# Patient Record
Sex: Female | Born: 1940
Health system: Southern US, Community
[De-identification: ages and names within clinical notes are randomized; demographics above are authoritative.]

## PROBLEM LIST (undated history)

## (undated) DIAGNOSIS — F329 Major depressive disorder, single episode, unspecified: Secondary | ICD-10-CM

## (undated) DIAGNOSIS — F32A Depression, unspecified: Secondary | ICD-10-CM

## (undated) DIAGNOSIS — C801 Malignant (primary) neoplasm, unspecified: Secondary | ICD-10-CM

## (undated) DIAGNOSIS — Z8619 Personal history of other infectious and parasitic diseases: Secondary | ICD-10-CM

## (undated) DIAGNOSIS — E039 Hypothyroidism, unspecified: Secondary | ICD-10-CM

## (undated) DIAGNOSIS — Z87440 Personal history of urinary (tract) infections: Secondary | ICD-10-CM

## (undated) DIAGNOSIS — Z789 Other specified health status: Secondary | ICD-10-CM

## (undated) DIAGNOSIS — K219 Gastro-esophageal reflux disease without esophagitis: Secondary | ICD-10-CM

## (undated) HISTORY — PX: CHOLECYSTECTOMY: SHX55

## (undated) HISTORY — PX: OTHER SURGICAL HISTORY: SHX169

## (undated) HISTORY — PX: BREAST LUMPECTOMY: SHX2

## (undated) HISTORY — DX: Personal history of other infectious and parasitic diseases: Z86.19

## (undated) HISTORY — DX: Malignant (primary) neoplasm, unspecified: C80.1

## (undated) HISTORY — DX: Personal history of urinary (tract) infections: Z87.440

---
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
## 2010-07-16 ENCOUNTER — Ambulatory Visit (HOSPITAL_COMMUNITY)
Admission: RE | Admit: 2010-07-16 | Discharge: 2010-07-16 | Payer: Self-pay | Source: Home / Self Care | Attending: Obstetrics & Gynecology | Admitting: Obstetrics & Gynecology

## 2011-05-26 ENCOUNTER — Emergency Department (HOSPITAL_BASED_OUTPATIENT_CLINIC_OR_DEPARTMENT_OTHER)
Admission: EM | Admit: 2011-05-26 | Discharge: 2011-05-26 | Disposition: A | Discharge status: Home / Self Care | Payer: Medicare Other | Attending: Emergency Medicine | Admitting: Emergency Medicine

## 2011-05-26 ENCOUNTER — Emergency Department (INDEPENDENT_AMBULATORY_CARE_PROVIDER_SITE_OTHER): Payer: Medicare Other

## 2011-05-26 ENCOUNTER — Encounter: Payer: Self-pay | Admitting: *Deleted

## 2011-05-26 DIAGNOSIS — S91009A Unspecified open wound, unspecified ankle, initial encounter: Secondary | ICD-10-CM

## 2011-05-26 DIAGNOSIS — Y9229 Other specified public building as the place of occurrence of the external cause: Secondary | ICD-10-CM | POA: Insufficient documentation

## 2011-05-26 DIAGNOSIS — S81012A Laceration without foreign body, left knee, initial encounter: Secondary | ICD-10-CM

## 2011-05-26 DIAGNOSIS — S81009A Unspecified open wound, unspecified knee, initial encounter: Secondary | ICD-10-CM | POA: Insufficient documentation

## 2011-05-26 DIAGNOSIS — W19XXXA Unspecified fall, initial encounter: Secondary | ICD-10-CM | POA: Insufficient documentation

## 2011-05-26 MED ORDER — TETANUS-DIPHTH-ACELL PERTUSSIS 5-2.5-18.5 LF-MCG/0.5 IM SUSP
0.5000 mL | Freq: Once | INTRAMUSCULAR | Status: AC
  Administered 2011-05-26: 0.5 mL via INTRAMUSCULAR
  Filled 2011-05-26: qty 0.5

## 2011-05-26 MED ORDER — LIDOCAINE HCL 2 % IJ SOLN
5.0000 mL | Freq: Once | INTRAMUSCULAR | Status: AC
  Administered 2011-05-26: 20 mg
  Filled 2011-05-26: qty 1

## 2011-05-26 NOTE — ED Notes (Signed)
EDP [facility] at bedside to suture left knee lac

## 2011-05-26 NOTE — ED Notes (Addendum)
Pt states she fell approx 1 hr ago and injured her left knee. Approx 3 cm lac to same. Bleeding controlled.

## 2011-05-26 NOTE — ED Provider Notes (Signed)
History  Scribed for Carleene Cooper III, MD, the patient was seen in [facility]. The chart was scribed by Gilman Schmidt. The patients care was started at 8:07 PM.   CSN: [id number] Arrival date & time: 05/26/2011  7:28 PM   First MD Initiated Contact with Patient 05/26/11 1944      Chief Complaint  Patient presents with  . Laceration    HPI Jasmine Williams is a 70 y.o. female who presents to the Emergency Department complaining of laceration to left knee. Pt reports falling ~1 hour ago on tile floor at [employer]. Pt was ambulatory after fall. Denies any LOC or any other injury. States that last Tetanus shot may have been more than 10 years ago.There are no other associated symptoms and no other alleviating or aggravating factors.    History reviewed. No pertinent past medical history.  Past Surgical History  Procedure Date  . Cholecystectomy     History reviewed. No pertinent family history.  History  Substance Use Topics  . Smoking status: Never Smoker   . Smokeless tobacco: Not on file  . Alcohol Use: No    OB History    Grav Para Term Preterm Abortions TAB SAB Ect Mult Living                  Review of Systems  HENT: Negative for sore throat and neck pain.   Cardiovascular: Negative for chest pain.  Genitourinary: Negative for dysuria and urgency.  Musculoskeletal: Negative for back pain.  Skin: Positive for wound.  Neurological: Negative for seizures, weakness, numbness and headaches.    Allergies  Review of patient's allergies indicates no known allergies.  Home Medications  No current outpatient prescriptions on file.  BP 166/64  Pulse 86  Temp(Src) 98.4 F (36.9 C) (Oral)  Resp 20  Ht 5' (1.524 m)  Wt 120 lb (54.432 kg)  BMI 23.44 kg/m2  SpO2 98%  Physical Exam  Constitutional: She is oriented to person, place, and time. She appears well-developed and well-nourished.  Non-toxic appearance. She does not have a sickly appearance.  HENT:  Head:  Normocephalic and atraumatic.  Eyes: Conjunctivae, EOM and lids are normal. Pupils are equal, round, and reactive to light. No scleral icterus.  Neck: Trachea normal and normal range of motion. Neck supple.  Cardiovascular: Normal rate, regular rhythm and normal heart sounds.   Pulmonary/Chest: Effort normal and breath sounds normal.  Abdominal: Soft. Normal appearance. There is no tenderness. There is no rebound, no guarding and no CVA tenderness.  Musculoskeletal: Normal range of motion.  Neurological: She is alert and oriented to person, place, and time. She has normal strength.  Skin: Skin is warm, dry and intact. No rash noted.       5cm laceration to right knee    ED Course  LACERATION REPAIR Date/Time: 05/26/2011 9:03 PM Performed by: Osvaldo Human Authorized by: Osvaldo Human Consent: Verbal consent obtained. Risks and benefits: risks, benefits and alternatives were discussed Consent given by: patient Site marked: the operative site was not marked Imaging studies: imaging studies available Required items: required blood products, implants, devices, and special equipment available Patient identity confirmed: verbally with patient Time out: Immediately prior to procedure a "time out" was called to verify the correct patient, procedure, equipment, support staff and site/side marked as required. Body area: lower extremity Location details: right knee Laceration length: 5 cm Foreign bodies: no foreign bodies Tendon involvement: none Nerve involvement: none Vascular damage: no Anesthesia: local infiltration  Local anesthetic: lidocaine 2% without epinephrine Patient sedated: no Preparation: Patient was prepped and draped in the usual sterile fashion. Irrigation solution: saline Irrigation method: tap Amount of cleaning: standard Debridement: none Skin closure: 3-0 nylon and 4-0 nylon Subcutaneous closure: 4-0 Vicryl Number of sutures: 11 Technique:  simple Approximation: close Approximation difficulty: simple Dressing: 4x4 sterile gauze Patient tolerance: Patient tolerated the procedure well with no immediate complications.   DIAGNOSTIC STUDIES: Oxygen Saturation is 98% on room air, normal by my interpretation.    COORDINATION OF CARE: 8:07:  - Patient evaluated by ED physician, DG Knee ordered  Radiology:  DG Knee Complete 4 View. Reviewed by me. IMPRESSION: No osseous abnormality. Original Report Authenticated By: Arnell Sieving, M.D.  Imp:  Laceration of left knee.   I personally performed the services described in this documentation, which was scribed in my presence. The recorded information has been reviewed and considered.  Osvaldo Human, M.D.    Carleene Cooper III, MD 05/26/11 2109

## 2011-05-26 NOTE — ED Notes (Signed)
No rx given- verbalizes understanding to have sutures out in 10 days

## 2011-06-27 ENCOUNTER — Other Ambulatory Visit: Payer: Self-pay | Admitting: Obstetrics & Gynecology

## 2011-06-27 DIAGNOSIS — R928 Other abnormal and inconclusive findings on diagnostic imaging of breast: Secondary | ICD-10-CM

## 2011-07-10 ENCOUNTER — Other Ambulatory Visit: Payer: Self-pay | Admitting: Obstetrics & Gynecology

## 2011-07-10 ENCOUNTER — Ambulatory Visit
Admission: RE | Admit: 2011-07-10 | Discharge: 2011-07-10 | Disposition: A | Discharge status: Home / Self Care | Payer: Medicare Other | Source: Ambulatory Visit | Attending: Obstetrics & Gynecology | Admitting: Obstetrics & Gynecology

## 2011-07-10 DIAGNOSIS — R928 Other abnormal and inconclusive findings on diagnostic imaging of breast: Secondary | ICD-10-CM

## 2011-07-10 DIAGNOSIS — R921 Mammographic calcification found on diagnostic imaging of breast: Secondary | ICD-10-CM

## 2011-07-17 ENCOUNTER — Ambulatory Visit
Admission: RE | Admit: 2011-07-17 | Discharge: 2011-07-17 | Disposition: A | Discharge status: Home / Self Care | Payer: Medicare Other | Source: Ambulatory Visit | Attending: Obstetrics & Gynecology | Admitting: Obstetrics & Gynecology

## 2011-07-17 ENCOUNTER — Other Ambulatory Visit: Payer: Self-pay | Admitting: Obstetrics & Gynecology

## 2011-07-17 DIAGNOSIS — R921 Mammographic calcification found on diagnostic imaging of breast: Secondary | ICD-10-CM

## 2011-07-17 DIAGNOSIS — C50319 Malignant neoplasm of lower-inner quadrant of unspecified female breast: Secondary | ICD-10-CM | POA: Diagnosis not present

## 2011-07-17 DIAGNOSIS — N6489 Other specified disorders of breast: Secondary | ICD-10-CM | POA: Diagnosis not present

## 2011-07-17 DIAGNOSIS — D0590 Unspecified type of carcinoma in situ of unspecified breast: Secondary | ICD-10-CM | POA: Diagnosis not present

## 2011-07-18 ENCOUNTER — Other Ambulatory Visit: Payer: Self-pay | Admitting: Obstetrics & Gynecology

## 2011-07-18 DIAGNOSIS — C50911 Malignant neoplasm of unspecified site of right female breast: Secondary | ICD-10-CM

## 2011-07-22 ENCOUNTER — Ambulatory Visit
Admission: RE | Admit: 2011-07-22 | Discharge: 2011-07-22 | Disposition: A | Discharge status: Home / Self Care | Payer: Medicare Other | Source: Ambulatory Visit | Attending: Obstetrics & Gynecology | Admitting: Obstetrics & Gynecology

## 2011-07-22 DIAGNOSIS — C50911 Malignant neoplasm of unspecified site of right female breast: Secondary | ICD-10-CM

## 2011-07-22 DIAGNOSIS — C50919 Malignant neoplasm of unspecified site of unspecified female breast: Secondary | ICD-10-CM | POA: Diagnosis not present

## 2011-07-22 MED ORDER — GADOBENATE DIMEGLUMINE 529 MG/ML IV SOLN
11.0000 mL | Freq: Once | INTRAVENOUS | Status: AC | PRN
  Administered 2011-07-22: 11 mL via INTRAVENOUS

## 2011-07-24 DIAGNOSIS — N83209 Unspecified ovarian cyst, unspecified side: Secondary | ICD-10-CM | POA: Diagnosis not present

## 2011-07-25 ENCOUNTER — Encounter (INDEPENDENT_AMBULATORY_CARE_PROVIDER_SITE_OTHER): Payer: Self-pay | Admitting: General Surgery

## 2011-07-25 ENCOUNTER — Other Ambulatory Visit (INDEPENDENT_AMBULATORY_CARE_PROVIDER_SITE_OTHER): Payer: Self-pay | Admitting: General Surgery

## 2011-07-25 ENCOUNTER — Ambulatory Visit (INDEPENDENT_AMBULATORY_CARE_PROVIDER_SITE_OTHER): Payer: Medicare Other | Admitting: General Surgery

## 2011-07-25 VITALS — BP 148/78 | HR 89 | Temp 97.7°F | Ht 60.0 in | Wt 125.0 lb

## 2011-07-25 DIAGNOSIS — C50319 Malignant neoplasm of lower-inner quadrant of unspecified female breast: Secondary | ICD-10-CM

## 2011-07-25 NOTE — Progress Notes (Signed)
Patient ID: Jasmine Williams, female   DOB: Sep 20, 1940, 71 y.o.   MRN: [id number]  Chief Complaint  Patient presents with  . Pre-op Exam    eval Rt br cancer    HPI Jasmine Williams is a 71 y.o. female.  Referred by Dr. Laveda Abbe HPI This is a 71 year old female with a history of fibrocystic breast changes who presents after going a regular routine screening mammogram with 2 separate areas of abnormality found in the lower right breast.  These were both biopsied. The first biopsy which is anterior showed DCIS.  The second biopsy shows invasive ductal carcinoma.  ER is positive at 100%, PR is positive at 11%, Ki67 is 75%, and Her2Neu is not amplified.  MRI has been performed that shows two separate areas of biopsy proven cancer within the lower right breast.  There is 5.6 cm between two cavities but they appear to be in the same ray of breast tissue after reviewing films with radiology.  There is no evidence of contralateral neoplasm or any abnormal nodes.  She comes in today to discuss treatment.  .History reviewed. No pertinent past medical history.  Past Surgical History  Procedure Date  . Cholecystectomy   . Broken leg     right as a child    Family History  Problem Relation Age of Onset  . Cancer Sister     breast    Social History History  Substance Use Topics  . Smoking status: Former Smoker    Quit date: 07/25/1971  . Smokeless tobacco: Not on file  . Alcohol Use: No    No Known Allergies  No current outpatient prescriptions on file.    Review of Systems Review of Systems  Constitutional: Negative for fever, chills and unexpected weight change.  HENT: Negative for hearing loss, congestion, sore throat, trouble swallowing and voice change.   Eyes: Negative for visual disturbance.  Respiratory: Negative for cough and wheezing.   Cardiovascular: Negative for chest pain, palpitations and leg swelling.  Gastrointestinal: Negative for nausea, vomiting, abdominal pain,  diarrhea, constipation, blood in stool, abdominal distention and anal bleeding.  Genitourinary: Negative for hematuria, vaginal bleeding and difficulty urinating.  Musculoskeletal: Negative for arthralgias.  Skin: Negative for rash and wound.  Neurological: Negative for seizures, syncope and headaches.  Hematological: Negative for adenopathy. Does not bruise/bleed easily.  Psychiatric/Behavioral: Negative for confusion.    Blood pressure 148/78, pulse 89, temperature 97.7 F (36.5 C), temperature source Temporal, height 5' (1.524 m), weight 125 lb (56.7 kg), SpO2 98.00%.  Physical Exam Physical Exam  Vitals reviewed. Constitutional: She appears well-developed and well-nourished.  Neck: Neck supple.  Cardiovascular: Normal rate, regular rhythm and normal heart sounds.   Pulmonary/Chest: Effort normal and breath sounds normal. She has no wheezes. She has no rales. Right breast exhibits no inverted nipple, no mass, no nipple discharge, no skin change and no tenderness. Left breast exhibits no inverted nipple, no mass, no nipple discharge, no skin change and no tenderness. Breasts are symmetrical.    Abdominal: Soft. There is no hepatomegaly.  Lymphadenopathy:    She has no cervical adenopathy.    Data Reviewed  BILATERAL BREAST MRI WITH AND WITHOUT CONTRAST  Technique: Multiplanar, multisequence MR images of both breasts  were obtained prior to and following the intravenous administration  of 11ml of Multihance. Three dimensional images were evaluated at  the independent DynaCad workstation.  Comparison: Recent mammograms from the [facility].  Findings: Mild normal background parenchymal enhancement  is  identified.  Two separate areas of enhancement within the lower right breast are  identified with biopsy clip artifact, compatible with biopsy-proven  neoplasm. This area measures 5.6 cm (AP) from the anterior aspect  of the anterior biopsy cavity to the posterior aspect of the    posterior biopsy cavity.  No other suspicious areas of enhancement within either breast  noted.  No enlarged or suspicious lymph nodes are identified.  No suspicious bony or upper hepatic abnormalities identified.  IMPRESSION:  Two separate areas of biopsy-proven neoplasm/postbiopsy changes  within the lower right breast (mid and posterior), with distance of  5.6 cm from the front of the anterior biopsy cavity to the back of  the posterior biopsy cavity.  No evidence of contralateral neoplasm or suspicious lymph nodes.   Assessment    Clinical stage I right breast cancer    Plan    Left breast bracketed wire lumpectomy, left axillary sentinel node biopsy  I think after reviewing films that this is amenable to lumpectomy and I think I can give her a good cosmetic result afterwards.  We discussed the staging and pathophysiology of breast cancer. We discussed all of the different options for treatment for breast cancer including surgery, chemotherapy, radiation therapy, Herceptin, and antiestrogen therapy.   We discussed a sentinel lymph node biopsy as she does not appear to having lymph node involvement right now. We discussed the performance of that with injection of radioactive tracer and blue dye. We discussed that she would have an incision underneath her axillary hairline. We discussed that there is a bout a 10-20% chance of having a positive node with a sentinel lymph node biopsy and we will await the permanent pathology to make any other first further decisions in terms of her treatment. One of these options might be to return to the operating room to perform an axillary lymph node dissection. We discussed about a 1-2% risk lifetime of chronic shoulder pain as well as lymphedema associated with a sentinel lymph node biopsy.  We discussed the options for treatment of the breast cancer which included lumpectomy versus a mastectomy. We discussed the performance of the lumpectomy with a  wire placement. We discussed a 10-20% chance of a positive margin requiring reexcision in the operating room. We also discussed that she may need radiation therapy or antiestrogen therapy or both if she undergoes lumpectomy. We discussed the mastectomy and the postoperative care for that as well. We discussed that there is no difference in her survival whether she undergoes lumpectomy with radiation therapy or antiestrogen therapy versus a mastectomy. There is a slight difference in the local recurrence rate being 3-5% with lumpectomy and about 1% with a mastectomy. We discussed the risks of operation including bleeding, infection, possible reoperation. She understands her further therapy will be based on what her stages at the time of her operation.         Anterio Scheel 07/25/2011, 4:02 PM

## 2011-07-30 ENCOUNTER — Encounter (HOSPITAL_BASED_OUTPATIENT_CLINIC_OR_DEPARTMENT_OTHER): Payer: Self-pay | Admitting: *Deleted

## 2011-07-30 NOTE — Progress Notes (Signed)
No meds-no heart or resp problems-to come in for labs,cxr,ekg

## 2011-07-31 ENCOUNTER — Encounter (HOSPITAL_BASED_OUTPATIENT_CLINIC_OR_DEPARTMENT_OTHER)
Admission: RE | Admit: 2011-07-31 | Discharge: 2011-07-31 | Disposition: A | Discharge status: Home / Self Care | Payer: Medicare Other | Source: Ambulatory Visit | Attending: General Surgery | Admitting: General Surgery

## 2011-07-31 ENCOUNTER — Ambulatory Visit
Admission: RE | Admit: 2011-07-31 | Discharge: 2011-07-31 | Disposition: A | Discharge status: Home / Self Care | Payer: Medicare Other | Source: Ambulatory Visit | Attending: General Surgery | Admitting: General Surgery

## 2011-07-31 DIAGNOSIS — Z01811 Encounter for preprocedural respiratory examination: Secondary | ICD-10-CM | POA: Diagnosis not present

## 2011-07-31 DIAGNOSIS — D0590 Unspecified type of carcinoma in situ of unspecified breast: Secondary | ICD-10-CM | POA: Diagnosis not present

## 2011-07-31 DIAGNOSIS — Z01812 Encounter for preprocedural laboratory examination: Secondary | ICD-10-CM | POA: Diagnosis not present

## 2011-07-31 DIAGNOSIS — Z170 Estrogen receptor positive status [ER+]: Secondary | ICD-10-CM | POA: Diagnosis not present

## 2011-07-31 DIAGNOSIS — C50319 Malignant neoplasm of lower-inner quadrant of unspecified female breast: Secondary | ICD-10-CM | POA: Diagnosis not present

## 2011-07-31 DIAGNOSIS — Z01810 Encounter for preprocedural cardiovascular examination: Secondary | ICD-10-CM | POA: Diagnosis not present

## 2011-07-31 LAB — COMPREHENSIVE METABOLIC PANEL
ALT: 14 U/L (ref 0–35)
AST: 19 U/L (ref 0–37)
Albumin: 3.6 g/dL (ref 3.5–5.2)
Alkaline Phosphatase: 104 U/L (ref 39–117)
Chloride: 106 mEq/L (ref 96–112)
Potassium: 4.8 mEq/L (ref 3.5–5.1)
Total Bilirubin: 0.7 mg/dL (ref 0.3–1.2)

## 2011-07-31 LAB — CBC
Platelets: 367 10*3/uL (ref 150–400)
RBC: 4.88 MIL/uL (ref 3.87–5.11)
RDW: 13.3 % (ref 11.5–15.5)
WBC: 7.6 10*3/uL (ref 4.0–10.5)

## 2011-07-31 LAB — CANCER ANTIGEN 27.29: CA 27.29: 25 U/mL (ref 0–39)

## 2011-08-05 ENCOUNTER — Encounter (HOSPITAL_BASED_OUTPATIENT_CLINIC_OR_DEPARTMENT_OTHER)
Admission: RE | Disposition: A | Discharge status: Home / Self Care | Payer: Self-pay | Source: Ambulatory Visit | Attending: General Surgery

## 2011-08-05 ENCOUNTER — Other Ambulatory Visit (INDEPENDENT_AMBULATORY_CARE_PROVIDER_SITE_OTHER): Payer: Self-pay | Admitting: General Surgery

## 2011-08-05 ENCOUNTER — Ambulatory Visit
Admission: RE | Admit: 2011-08-05 | Discharge: 2011-08-05 | Disposition: A | Discharge status: Home / Self Care | Payer: Medicare Other | Source: Ambulatory Visit | Attending: General Surgery | Admitting: General Surgery

## 2011-08-05 ENCOUNTER — Ambulatory Visit (HOSPITAL_BASED_OUTPATIENT_CLINIC_OR_DEPARTMENT_OTHER): Payer: Medicare Other | Admitting: Anesthesiology

## 2011-08-05 ENCOUNTER — Ambulatory Visit (HOSPITAL_BASED_OUTPATIENT_CLINIC_OR_DEPARTMENT_OTHER)
Admission: RE | Admit: 2011-08-05 | Discharge: 2011-08-05 | Disposition: A | Discharge status: Home / Self Care | Payer: Medicare Other | Source: Ambulatory Visit | Attending: General Surgery | Admitting: General Surgery

## 2011-08-05 ENCOUNTER — Encounter (HOSPITAL_BASED_OUTPATIENT_CLINIC_OR_DEPARTMENT_OTHER): Payer: Self-pay | Admitting: *Deleted

## 2011-08-05 ENCOUNTER — Encounter (HOSPITAL_BASED_OUTPATIENT_CLINIC_OR_DEPARTMENT_OTHER): Payer: Self-pay | Admitting: Anesthesiology

## 2011-08-05 ENCOUNTER — Ambulatory Visit (HOSPITAL_COMMUNITY)
Admission: RE | Admit: 2011-08-05 | Discharge: 2011-08-05 | Disposition: A | Discharge status: Home / Self Care | Payer: Medicare Other | Source: Ambulatory Visit | Attending: General Surgery | Admitting: General Surgery

## 2011-08-05 DIAGNOSIS — D0590 Unspecified type of carcinoma in situ of unspecified breast: Secondary | ICD-10-CM | POA: Diagnosis not present

## 2011-08-05 DIAGNOSIS — C50319 Malignant neoplasm of lower-inner quadrant of unspecified female breast: Secondary | ICD-10-CM | POA: Diagnosis not present

## 2011-08-05 DIAGNOSIS — Z170 Estrogen receptor positive status [ER+]: Secondary | ICD-10-CM | POA: Insufficient documentation

## 2011-08-05 DIAGNOSIS — Z01810 Encounter for preprocedural cardiovascular examination: Secondary | ICD-10-CM | POA: Diagnosis not present

## 2011-08-05 DIAGNOSIS — Z01812 Encounter for preprocedural laboratory examination: Secondary | ICD-10-CM | POA: Insufficient documentation

## 2011-08-05 DIAGNOSIS — C50919 Malignant neoplasm of unspecified site of unspecified female breast: Secondary | ICD-10-CM | POA: Diagnosis not present

## 2011-08-05 DIAGNOSIS — N6489 Other specified disorders of breast: Secondary | ICD-10-CM | POA: Diagnosis not present

## 2011-08-05 HISTORY — DX: Other specified health status: Z78.9

## 2011-08-05 LAB — POCT HEMOGLOBIN-HEMACUE: Hemoglobin: 15.7 g/dL — ABNORMAL HIGH (ref 12.0–15.0)

## 2011-08-05 SURGERY — BREAST LUMPECTOMY WITH SENTINEL LYMPH NODE BX
Anesthesia: General | Site: Breast | Laterality: Right

## 2011-08-05 MED ORDER — OXYCODONE-ACETAMINOPHEN 5-325 MG PO TABS: 1.0000 | ORAL_TABLET | ORAL | Status: AC | PRN

## 2011-08-05 MED ORDER — ONDANSETRON HCL 4 MG/2ML IJ SOLN
INTRAMUSCULAR | Status: DC | PRN
  Administered 2011-08-05: 4 mg via INTRAVENOUS

## 2011-08-05 MED ORDER — HYDROMORPHONE HCL PF 1 MG/ML IJ SOLN
0.2500 mg | INTRAMUSCULAR | Status: DC | PRN
  Administered 2011-08-05 (×3): 0.25 mg via INTRAVENOUS

## 2011-08-05 MED ORDER — CEFAZOLIN SODIUM 1-5 GM-% IV SOLN
1.0000 g | INTRAVENOUS | Status: AC
  Administered 2011-08-05: 1 g via INTRAVENOUS

## 2011-08-05 MED ORDER — MIDAZOLAM HCL 2 MG/2ML IJ SOLN
1.0000 mg | INTRAMUSCULAR | Status: DC | PRN
  Administered 2011-08-05: 2 mg via INTRAVENOUS

## 2011-08-05 MED ORDER — DEXAMETHASONE SODIUM PHOSPHATE 4 MG/ML IJ SOLN
INTRAMUSCULAR | Status: DC | PRN
  Administered 2011-08-05: 10 mg via INTRAVENOUS

## 2011-08-05 MED ORDER — TECHNETIUM TC 99M SULFUR COLLOID FILTERED
1.0000 | Freq: Once | INTRAVENOUS | Status: AC | PRN
  Administered 2011-08-05: 1 via INTRADERMAL

## 2011-08-05 MED ORDER — LACTATED RINGERS IV SOLN
INTRAVENOUS | Status: DC
  Administered 2011-08-05 (×2): via INTRAVENOUS

## 2011-08-05 MED ORDER — DROPERIDOL 2.5 MG/ML IJ SOLN: 0.6250 mg | INTRAMUSCULAR | Status: DC | PRN

## 2011-08-05 MED ORDER — BUPIVACAINE HCL (PF) 0.25 % IJ SOLN
INTRAMUSCULAR | Status: DC | PRN
  Administered 2011-08-05: 20 mL

## 2011-08-05 MED ORDER — LIDOCAINE HCL (CARDIAC) 20 MG/ML IV SOLN
INTRAVENOUS | Status: DC | PRN
  Administered 2011-08-05: 60 mg via INTRAVENOUS

## 2011-08-05 MED ORDER — SODIUM CHLORIDE 0.9 % IJ SOLN
INTRAMUSCULAR | Status: DC | PRN
  Administered 2011-08-05: 13:00:00 via INTRAMUSCULAR

## 2011-08-05 MED ORDER — FENTANYL CITRATE 0.05 MG/ML IJ SOLN
50.0000 ug | INTRAMUSCULAR | Status: DC | PRN
  Administered 2011-08-05: 100 ug via INTRAVENOUS

## 2011-08-05 MED ORDER — PROPOFOL 10 MG/ML IV EMUL
INTRAVENOUS | Status: DC | PRN
  Administered 2011-08-05: 100 mg via INTRAVENOUS

## 2011-08-05 MED ORDER — FENTANYL CITRATE 0.05 MG/ML IJ SOLN
INTRAMUSCULAR | Status: DC | PRN
  Administered 2011-08-05 (×2): 50 ug via INTRAVENOUS

## 2011-08-05 SURGICAL SUPPLY — 60 items
APPLIER CLIP 9.375 MED OPEN (MISCELLANEOUS) ×2
BENZOIN TINCTURE PRP APPL 2/3 (GAUZE/BANDAGES/DRESSINGS) ×2 IMPLANT
BINDER BREAST LRG (GAUZE/BANDAGES/DRESSINGS) ×2 IMPLANT
BINDER BREAST MEDIUM (GAUZE/BANDAGES/DRESSINGS) IMPLANT
BINDER BREAST XLRG (GAUZE/BANDAGES/DRESSINGS) IMPLANT
BINDER BREAST XXLRG (GAUZE/BANDAGES/DRESSINGS) IMPLANT
BLADE SURG 15 STRL LF DISP TIS (BLADE) ×1 IMPLANT
BLADE SURG 15 STRL SS (BLADE) ×1
BNDG COHESIVE 4X5 TAN STRL (GAUZE/BANDAGES/DRESSINGS) IMPLANT
CANISTER SUCTION 1200CC (MISCELLANEOUS) ×2 IMPLANT
CHLORAPREP W/TINT 26ML (MISCELLANEOUS) ×2 IMPLANT
CLIP APPLIE 9.375 MED OPEN (MISCELLANEOUS) ×1 IMPLANT
CLOTH BEACON ORANGE TIMEOUT ST (SAFETY) ×2 IMPLANT
COVER MAYO STAND STRL (DRAPES) ×4 IMPLANT
COVER PROBE W GEL 5X96 (DRAPES) ×2 IMPLANT
COVER TABLE BACK 60X90 (DRAPES) ×2 IMPLANT
DECANTER SPIKE VIAL GLASS SM (MISCELLANEOUS) IMPLANT
DERMABOND ADVANCED (GAUZE/BANDAGES/DRESSINGS)
DERMABOND ADVANCED .7 DNX12 (GAUZE/BANDAGES/DRESSINGS) IMPLANT
DEVICE DUBIN W/COMP PLATE 8390 (MISCELLANEOUS) ×2 IMPLANT
DRAIN CHANNEL 19F RND (DRAIN) IMPLANT
DRAPE LAPAROSCOPIC ABDOMINAL (DRAPES) ×2 IMPLANT
DRAPE U-SHAPE 76X120 STRL (DRAPES) IMPLANT
DRSG TEGADERM 4X4.75 (GAUZE/BANDAGES/DRESSINGS) ×4 IMPLANT
ELECT COATED BLADE 2.86 ST (ELECTRODE) ×2 IMPLANT
ELECT REM PT RETURN 9FT ADLT (ELECTROSURGICAL) ×2
ELECTRODE REM PT RTRN 9FT ADLT (ELECTROSURGICAL) ×1 IMPLANT
EVACUATOR SILICONE 100CC (DRAIN) IMPLANT
GAUZE SPONGE 4X4 12PLY STRL LF (GAUZE/BANDAGES/DRESSINGS) ×4 IMPLANT
GLOVE BIO SURGEON STRL SZ 6.5 (GLOVE) ×2 IMPLANT
GLOVE BIO SURGEON STRL SZ7 (GLOVE) ×2 IMPLANT
GLOVE BIOGEL PI IND STRL 7.5 (GLOVE) ×1 IMPLANT
GLOVE BIOGEL PI INDICATOR 7.5 (GLOVE) ×1
GOWN PREVENTION PLUS XLARGE (GOWN DISPOSABLE) ×2 IMPLANT
KIT MARKER MARGIN INK (KITS) ×2 IMPLANT
NDL SAFETY ECLIPSE 18X1.5 (NEEDLE) ×1 IMPLANT
NEEDLE HYPO 18GX1.5 SHARP (NEEDLE) ×1
NEEDLE HYPO 25X1 1.5 SAFETY (NEEDLE) ×4 IMPLANT
NS IRRIG 1000ML POUR BTL (IV SOLUTION) IMPLANT
PACK BASIN DAY SURGERY FS (CUSTOM PROCEDURE TRAY) ×2 IMPLANT
PENCIL BUTTON HOLSTER BLD 10FT (ELECTRODE) ×2 IMPLANT
PIN SAFETY STERILE (MISCELLANEOUS) IMPLANT
SLEEVE SCD COMPRESS KNEE MED (MISCELLANEOUS) ×2 IMPLANT
SPONGE LAP 18X18 X RAY DECT (DISPOSABLE) IMPLANT
SPONGE LAP 4X18 X RAY DECT (DISPOSABLE) ×2 IMPLANT
STOCKINETTE IMPERVIOUS LG (DRAPES) IMPLANT
STRIP CLOSURE SKIN 1/2X4 (GAUZE/BANDAGES/DRESSINGS) ×2 IMPLANT
SUT MNCRL AB 4-0 PS2 18 (SUTURE) ×4 IMPLANT
SUT SILK 2 0 SH (SUTURE) IMPLANT
SUT VIC AB 2-0 SH 27 (SUTURE) ×4
SUT VIC AB 2-0 SH 27XBRD (SUTURE) ×4 IMPLANT
SUT VIC AB 3-0 SH 27 (SUTURE) ×1
SUT VIC AB 3-0 SH 27X BRD (SUTURE) ×1 IMPLANT
SUT VICRYL AB 3 0 TIES (SUTURE) IMPLANT
SYR CONTROL 10ML LL (SYRINGE) ×4 IMPLANT
TOWEL OR 17X24 6PK STRL BLUE (TOWEL DISPOSABLE) ×2 IMPLANT
TOWEL OR NON WOVEN STRL DISP B (DISPOSABLE) ×2 IMPLANT
TUBE CONNECTING 20X1/4 (TUBING) ×2 IMPLANT
WATER STERILE IRR 1000ML POUR (IV SOLUTION) ×2 IMPLANT
YANKAUER SUCT BULB TIP NO VENT (SUCTIONS) ×2 IMPLANT

## 2011-08-05 NOTE — Interval H&P Note (Signed)
History and Physical Interval Note:  08/05/2011 11:15 AM  Jasmine Williams  has presented today for surgery, with the diagnosis of right breast cancer  The various methods of treatment have been discussed with the patient and family. After consideration of risks, benefits and other options for treatment, the patient has consented to  Procedure(s): Right breast wire guided lumpectomy and right axillary sentinel node biopsy as a surgical intervention .  The patients' history has been reviewed, patient examined, no change in status, stable for surgery.  I have reviewed the patients' chart and labs.  Questions were answered to the patient's satisfaction.     Concettina Leth

## 2011-08-05 NOTE — Transfer of Care (Signed)
Immediate Anesthesia Transfer of Care Note  Patient: Jasmine Williams  Procedure(s) Performed:  BREAST LUMPECTOMY WITH SENTINEL LYMPH NODE BX - Right breast bracketed wire lumpectomy, right axillary sentinel node biopsy  Patient Location: PACU  Anesthesia Type: General  Level of Consciousness: awake and alert   Airway & Oxygen Therapy: Patient Spontanous Breathing and Patient connected to face mask oxygen  Post-op Assessment: Report given to PACU RN and Post -op Vital signs reviewed and stable  Post vital signs: Reviewed and stable  Complications: No apparent anesthesia complications

## 2011-08-05 NOTE — Op Note (Signed)
Preoperative diagnosis: Clinical stage I multifocal right breast cancer Postoperative diagnosis: Same as above  Procedure: #1 right breast wire-guided lumpectomy, bracketed #2 right breast axillary sentinel lymph node biopsy #3 injection of methylene blue dye for sentinel lymph node identification  Surgeon: Dr. Harden Mo Anesthesia: Gen. With LMA Specimens: #1 right breast tissue marked with paint kit #2 deep margin marked short stitch superior, long stitch lateral, double stitch deep #3 right axillary sentinel lymph node #1 with count of 309 #4 right axillary sentinel lymph node #2 with count of 125  Complications: None Findings: Mammographic confirmation as well as radiologic confirmation of removal of both lesions Estimate blood loss: Minimal Sponge and needle count was correct x2 at end of operation Disposition of patient to recovery room in stable condition  Indications: This is a 71 year old female who on a routine screening mammogram was found to have 2 abnormalities in the right lower inner quadrant. Both of these underwent biopsy with the first showing invasive ductal carcinoma and the other showing ductal carcinoma in situ. These were about 5.6 cm away on her mammogram. She had an MRI that showed no additional disease or any lymphadenopathy. We discussed all of her options I told her that due to their locations I thought we could get both of these out through a generous lumpectomy. She very much wants undergo breast conservation therapy so we plan to proceed with a lumpectomy with two wires and an axillary sentinel node biopsy.  Procedure: After informed consent was obtained the patient was first taken to the breast center and had 2 wires placed. I had these mammograms available for my review of the operating room. She was then brought to day surgery and had technetium administered in the standard periareolar fashion. She was administered 1 g of intravenous cefazolin.  Sequential compression devices were placed on her legs. She was in place a general anesthesia with an LMA. Her right breast and axilla were then prepped and draped in the standard sterile surgical fashion. A surgical timeout was then performed.  I first approached the area in the breast. I made an elliptical incision encompassing both wires as well as the skin surrounding that and the core needle biopsy site. I then used cautery to excise a generous lumpectomy specimen including both of these wires. Mammogram was then taken confirming removal of both the lesions as well as by the radiologist. I did remove a small amount of extra tissue to ensure that the deep margin was the pectoralis muscle. I then packed this with a sponge.  I then made an incision below the hairline in the right axilla. I used the neoprobe to identify 2 axillary sentinel node the counts listed as above. The background radioactivity was less than 5. I placed a sponge in the axilla.  I then went back to the breast and placed 2 clips deep and one clip in each corporal position around the cavity. I did mobilize the breast tissue off the pectoralis muscle. I then closed this in several layers including a 2-0 Vicryl for the deep tissue. A 3-0 Vicryl for another layer of breast tissue as well as the dermis. I then closed the skin with a 4 Monocryl.  I then went back to the axilla and observed hemostasis. I closed the axillary fascia with 2-0 Vicryl. The dermis was closed with a 3-0 Vicryl and the skin with a 4 Monocryl.  I infiltrated quarter percent Marcaine throughout both of these incisions. I then placed steristrips and  a sterile dressing. A breast binder was also placed she tolerated this well was transferred recovery room in stable condition.

## 2011-08-05 NOTE — H&P (View-Only) (Signed)
Patient ID: Jasmine Williams, female   DOB: 06/02/1941, 70 y.o.   MRN: [id number]  Chief Complaint  Patient presents with  . Pre-op Exam    eval Rt br cancer    HPI Jasmine Williams is a 70 y.o. female.  Referred by Dr. Jeff Hu HPI This is a 70-year-old female with a history of fibrocystic breast changes who presents after going a regular routine screening mammogram with 2 separate areas of abnormality found in the lower right breast.  These were both biopsied. The first biopsy which is anterior showed DCIS.  The second biopsy shows invasive ductal carcinoma.  ER is positive at 100%, PR is positive at 11%, Ki67 is 75%, and Her2Neu is not amplified.  MRI has been performed that shows two separate areas of biopsy proven cancer within the lower right breast.  There is 5.6 cm between two cavities but they appear to be in the same ray of breast tissue after reviewing films with radiology.  There is no evidence of contralateral neoplasm or any abnormal nodes.  She comes in today to discuss treatment.  .History reviewed. No pertinent past medical history.  Past Surgical History  Procedure Date  . Cholecystectomy   . Broken leg     right as a child    Family History  Problem Relation Age of Onset  . Cancer Sister     breast    Social History History  Substance Use Topics  . Smoking status: Former Smoker    Quit date: 07/25/1971  . Smokeless tobacco: Not on file  . Alcohol Use: No    No Known Allergies  No current outpatient prescriptions on file.    Review of Systems Review of Systems  Constitutional: Negative for fever, chills and unexpected weight change.  HENT: Negative for hearing loss, congestion, sore throat, trouble swallowing and voice change.   Eyes: Negative for visual disturbance.  Respiratory: Negative for cough and wheezing.   Cardiovascular: Negative for chest pain, palpitations and leg swelling.  Gastrointestinal: Negative for nausea, vomiting, abdominal pain,  diarrhea, constipation, blood in stool, abdominal distention and anal bleeding.  Genitourinary: Negative for hematuria, vaginal bleeding and difficulty urinating.  Musculoskeletal: Negative for arthralgias.  Skin: Negative for rash and wound.  Neurological: Negative for seizures, syncope and headaches.  Hematological: Negative for adenopathy. Does not bruise/bleed easily.  Psychiatric/Behavioral: Negative for confusion.    Blood pressure 148/78, pulse 89, temperature 97.7 F (36.5 C), temperature source Temporal, height 5' (1.524 m), weight 125 lb (56.7 kg), SpO2 98.00%.  Physical Exam Physical Exam  Vitals reviewed. Constitutional: She appears well-developed and well-nourished.  Neck: Neck supple.  Cardiovascular: Normal rate, regular rhythm and normal heart sounds.   Pulmonary/Chest: Effort normal and breath sounds normal. She has no wheezes. She has no rales. Right breast exhibits no inverted nipple, no mass, no nipple discharge, no skin change and no tenderness. Left breast exhibits no inverted nipple, no mass, no nipple discharge, no skin change and no tenderness. Breasts are symmetrical.    Abdominal: Soft. There is no hepatomegaly.  Lymphadenopathy:    She has no cervical adenopathy.    Data Reviewed  BILATERAL BREAST MRI WITH AND WITHOUT CONTRAST  Technique: Multiplanar, multisequence MR images of both breasts  were obtained prior to and following the intravenous administration  of 11ml of Multihance. Three dimensional images were evaluated at  the independent DynaCad workstation.  Comparison: [facility].  Findings: Mild normal background parenchymal enhancement   is  identified.  Two separate areas of enhancement within the lower right breast are  identified with biopsy clip artifact, compatible with biopsy-proven  neoplasm. This area measures 5.6 cm (AP) from the anterior aspect  of the anterior biopsy cavity to the posterior aspect of the    posterior biopsy cavity.  No other suspicious areas of enhancement within either breast  noted.  No enlarged or suspicious lymph nodes are identified.  No suspicious bony or upper hepatic abnormalities identified.  IMPRESSION:  Two separate areas of biopsy-proven neoplasm/postbiopsy changes  within the lower right breast (mid and posterior), with distance of  5.6 cm from the front of the anterior biopsy cavity to the back of  the posterior biopsy cavity.  No evidence of contralateral neoplasm or suspicious lymph nodes.   Assessment    Clinical stage I right breast cancer    Plan    Left breast bracketed wire lumpectomy, left axillary sentinel node biopsy  I think after reviewing films that this is amenable to lumpectomy and I think I can give her a good cosmetic result afterwards.  We discussed the staging and pathophysiology of breast cancer. We discussed all of the different options for treatment for breast cancer including surgery, chemotherapy, radiation therapy, Herceptin, and antiestrogen therapy.   We discussed a sentinel lymph node biopsy as she does not appear to having lymph node involvement right now. We discussed the performance of that with injection of radioactive tracer and blue dye. We discussed that she would have an incision underneath her axillary hairline. We discussed that there is a bout a 10-20% chance of having a positive node with a sentinel lymph node biopsy and we will await the permanent pathology to make any other first further decisions in terms of her treatment. One of these options might be to return to the operating room to perform an axillary lymph node dissection. We discussed about a 1-2% risk lifetime of chronic shoulder pain as well as lymphedema associated with a sentinel lymph node biopsy.  We discussed the options for treatment of the breast cancer which included lumpectomy versus a mastectomy. We discussed the performance of the lumpectomy with a  wire placement. We discussed a 10-20% chance of a positive margin requiring reexcision in the operating room. We also discussed that she may need radiation therapy or antiestrogen therapy or both if she undergoes lumpectomy. We discussed the mastectomy and the postoperative care for that as well. We discussed that there is no difference in her survival whether she undergoes lumpectomy with radiation therapy or antiestrogen therapy versus a mastectomy. There is a slight difference in the local recurrence rate being 3-5% with lumpectomy and about 1% with a mastectomy. We discussed the risks of operation including bleeding, infection, possible reoperation. She understands her further therapy will be based on what her stages at the time of her operation.         Jasmine Williams 07/25/2011, 4:02 PM    

## 2011-08-05 NOTE — Progress Notes (Signed)
Nuc Med injection done in pre op. Pt received versed and fentanyl for sedation and tolerated procedure well. See VS documented in doc flowsheets. Family now at bedside.

## 2011-08-05 NOTE — Anesthesia Preprocedure Evaluation (Signed)
Anesthesia Evaluation  Patient identified by MRN, date of birth, ID band Patient awake    Reviewed: Allergy & Precautions, H&P , NPO status , Patient's Chart, lab work & pertinent test results  History of Anesthesia Complications Negative for: history of anesthetic complications  Airway       Dental   Pulmonary neg pulmonary ROS,  clear to auscultation  Pulmonary exam normal       Cardiovascular neg cardio ROS Regular Normal- Systolic murmurs    Neuro/Psych Negative Neurological ROS     GI/Hepatic negative GI ROS, Neg liver ROS,   Endo/Other  Negative Endocrine ROS  Renal/GU negative Renal ROS     Musculoskeletal   Abdominal   Peds  Hematology negative hematology ROS (+)   Anesthesia Other Findings   Reproductive/Obstetrics                           Anesthesia Physical Anesthesia Plan  ASA: II  Anesthesia Plan: General   Post-op Pain Management:    Induction: Intravenous  Airway Management Planned: LMA  Additional Equipment:   Intra-op Plan:   Post-operative Plan: Extubation in OR  Informed Consent:   Plan Discussed with: CRNA, Anesthesiologist and Surgeon  Anesthesia Plan Comments:         Anesthesia Quick Evaluation

## 2011-08-05 NOTE — Anesthesia Procedure Notes (Signed)
Procedure Name: LMA Insertion Date/Time: 08/05/2011 12:19 PM Performed by: Radford Pax Pre-anesthesia Checklist: Patient identified, Emergency Drugs available, Suction available and Patient being monitored Patient Re-evaluated:Patient Re-evaluated prior to inductionOxygen Delivery Method: Circle System Utilized Preoxygenation: Pre-oxygenation with 100% oxygen Intubation Type: IV induction Ventilation: Mask ventilation without difficulty LMA: LMA inserted LMA Size: 3.0 Number of attempts: 1 Placement Confirmation: breath sounds checked- equal and bilateral and positive ETCO2 Tube secured with: Tape Dental Injury: Teeth and Oropharynx as per pre-operative assessment     Performed by: Radford Pax

## 2011-08-05 NOTE — Anesthesia Postprocedure Evaluation (Signed)
Anesthesia Post Note  Patient: Jasmine Williams  Procedure(s) Performed:  BREAST LUMPECTOMY WITH SENTINEL LYMPH NODE BX - Right breast bracketed wire lumpectomy, right axillary sentinel node biopsy  Anesthesia type: general  Patient location: PACU  Post pain: Pain level controlled  Post assessment: Patient's Cardiovascular Status Stable  Last Vitals:  Filed Vitals:   08/05/11 1500  BP: 130/59  Pulse: 70  Temp:   Resp: 10    Post vital signs: Reviewed and stable  Level of consciousness: sedated  Complications: No apparent anesthesia complications

## 2011-08-07 ENCOUNTER — Telehealth (INDEPENDENT_AMBULATORY_CARE_PROVIDER_SITE_OTHER): Payer: Self-pay | Admitting: General Surgery

## 2011-08-18 ENCOUNTER — Encounter (HOSPITAL_BASED_OUTPATIENT_CLINIC_OR_DEPARTMENT_OTHER): Payer: Self-pay

## 2011-08-18 ENCOUNTER — Emergency Department (INDEPENDENT_AMBULATORY_CARE_PROVIDER_SITE_OTHER): Payer: Medicare Other

## 2011-08-18 ENCOUNTER — Emergency Department (HOSPITAL_BASED_OUTPATIENT_CLINIC_OR_DEPARTMENT_OTHER)
Admission: EM | Admit: 2011-08-18 | Discharge: 2011-08-18 | Disposition: A | Discharge status: Home / Self Care | Payer: Medicare Other | Attending: Emergency Medicine | Admitting: Emergency Medicine

## 2011-08-18 DIAGNOSIS — S81019A Laceration without foreign body, unspecified knee, initial encounter: Secondary | ICD-10-CM

## 2011-08-18 DIAGNOSIS — S99919A Unspecified injury of unspecified ankle, initial encounter: Secondary | ICD-10-CM

## 2011-08-18 DIAGNOSIS — W19XXXA Unspecified fall, initial encounter: Secondary | ICD-10-CM | POA: Insufficient documentation

## 2011-08-18 DIAGNOSIS — S91009A Unspecified open wound, unspecified ankle, initial encounter: Secondary | ICD-10-CM | POA: Insufficient documentation

## 2011-08-18 DIAGNOSIS — S81009A Unspecified open wound, unspecified knee, initial encounter: Secondary | ICD-10-CM | POA: Insufficient documentation

## 2011-08-18 DIAGNOSIS — S8990XA Unspecified injury of unspecified lower leg, initial encounter: Secondary | ICD-10-CM

## 2011-08-18 DIAGNOSIS — S81809A Unspecified open wound, unspecified lower leg, initial encounter: Secondary | ICD-10-CM | POA: Diagnosis not present

## 2011-08-18 DIAGNOSIS — X58XXXA Exposure to other specified factors, initial encounter: Secondary | ICD-10-CM | POA: Diagnosis not present

## 2011-08-18 DIAGNOSIS — M7989 Other specified soft tissue disorders: Secondary | ICD-10-CM

## 2011-08-18 DIAGNOSIS — S99929A Unspecified injury of unspecified foot, initial encounter: Secondary | ICD-10-CM | POA: Diagnosis not present

## 2011-08-18 DIAGNOSIS — Y9229 Other specified public building as the place of occurrence of the external cause: Secondary | ICD-10-CM | POA: Insufficient documentation

## 2011-08-18 MED ORDER — LIDOCAINE HCL 2 % IJ SOLN
INTRAMUSCULAR | Status: AC
  Filled 2011-08-18: qty 1

## 2011-08-18 MED ORDER — LIDOCAINE HCL (PF) 1 % IJ SOLN: 5.0000 mL | Freq: Once | INTRAMUSCULAR | Status: DC

## 2011-08-18 NOTE — ED Notes (Signed)
Pt states that she fell on her L knee today.  Pt states that she just injured her L knee a few months ago.  Pt with 2 inch laceration to the L knee.  Bleeding ceased.  Bandage applied in triage.  Last tetanus at time of last visit to [facility] ed.

## 2011-08-18 NOTE — ED Provider Notes (Signed)
History   This chart was scribed for Forbes Cellar, MD by Charolett Bumpers . The patient was seen in room MH09/MH09 and the patient's care was started at 3:28pm.  CSN: [id number]  Arrival date & time 08/18/11  1322   First MD Initiated Contact with Patient 08/18/11 1506      Chief Complaint  Patient presents with  . Knee Injury    (Consider location/radiation/quality/duration/timing/severity/associated sxs/prior treatment) HPI Jasmine Williams is a 71 y.o. female who presents to the Emergency Department complaining of a moderate, left knee injury that occurred today about 3 hours ago. Patient was at church when she fell and injured her left knee. Patient does not report any associated symptoms or pain. Patient denies headache, dizziness, SOB, chest pain, abdominal pain, and generalized weakness. She did lightly strike her face on the floor. Denies headache,dizziness, nose or facial pain, no dental injury. Teeth feel normally aligned. Patient denies any recent sickness. Patient denies numbness, weakness, or tingling in her extremities. Patient denies any h/o knee replacements. Patient does state that she had a similar injury about a month ago. Patient states that her tetanus is up to date.  ED Notes, ED Provider Notes from 08/18/11 0000 to 08/18/11 13:42:50       Maryruth Hancock, RN 08/18/2011 13:41      Pt states that she fell on her L knee today. Pt states that she just injured her L knee a few months ago. Pt with 2 inch laceration to the L knee. Bleeding ceased. Bandage applied in triage. Last tetanus at time of last visit to [facility] ed.      Past Medical History  Diagnosis Date  . No pertinent past medical history     Past Surgical History  Procedure Date  . Cholecystectomy   . Broken leg     right as a child  . Breast lumpectomy     Family History  Problem Relation Age of Onset  . Cancer Sister     breast    History  Substance Use Topics  . Smoking status: Former  Smoker    Quit date: 07/25/1971  . Smokeless tobacco: Not on file  . Alcohol Use: Yes     rare    OB History    Grav Para Term Preterm Abortions TAB SAB Ect Mult Living                  Review of Systems A complete 10 system review of systems was obtained and is otherwise negative except as noted in the HPI and PMH.   Allergies  Review of patient's allergies indicates no known allergies.  Home Medications  No current outpatient prescriptions on file.  BP 150/58  Pulse 80  Temp(Src) 98.3 F (36.8 C) (Oral)  Resp 18  Ht 5' (1.524 m)  Wt 125 lb (56.7 kg)  BMI 24.41 kg/m2  SpO2 98%  Physical Exam  Nursing note and vitals reviewed. Constitutional: She is oriented to person, place, and time. She appears well-developed and well-nourished. No distress.  HENT:  Head: Normocephalic.  Mouth/Throat: Oropharynx is clear and moist.       Upper lip with .5cm superficial laceration  Eyes: EOM are normal. Pupils are equal, round, and reactive to light.  Neck: Neck supple. No tracheal deviation present.  Cardiovascular: Normal rate, regular rhythm and normal heart sounds.  Exam reveals no gallop and no friction rub.   No murmur heard. Pulmonary/Chest: Effort normal and breath sounds normal. No  respiratory distress. She has no wheezes. She has no rales.  Abdominal: Soft. Bowel sounds are normal. She exhibits no distension. There is no tenderness. There is no rebound and no guarding.  Musculoskeletal: Normal range of motion. She exhibits no edema.       2.5 cm laceration to anterior left knee. No tenderness to palpation of left knee, medial or lateral joint line. Anterior and posterior drawer test is unremarkable. No crepitus. DP and PT intact. Strength 5/5    Neurological: She is alert and oriented to person, place, and time. No sensory deficit.  Skin: Skin is warm and dry.  Psychiatric: She has a normal mood and affect. Her behavior is normal.    ED Course  LACERATION  REPAIR Date/Time: 08/18/2011 4:04 PM Performed by: Forbes Cellar Authorized by: Forbes Cellar Consent: Verbal consent obtained. Risks and benefits: risks, benefits and alternatives were discussed Consent given by: patient Patient understanding: patient states understanding of the procedure being performed Patient consent: the patient's understanding of the procedure matches consent given Procedure consent: procedure consent matches procedure scheduled Patient identity confirmed: arm band Body area: lower extremity Laceration length: 2.5 cm Foreign bodies: no foreign bodies Tendon involvement: none Nerve involvement: none Vascular damage: no Anesthesia: local infiltration Local anesthetic: lidocaine 2% without epinephrine Anesthetic total: 2 ml Irrigation solution: saline Amount of cleaning: standard Skin closure: 4-0 nylon Number of sutures: 7 Technique: simple Approximation difficulty: simple Dressing: antibiotic ointment and gauze roll Patient tolerance: Patient tolerated the procedure well with no immediate complications.   (including critical care time)  DIAGNOSTIC STUDIES: Oxygen Saturation is 98% on room air, normal by my interpretation.    COORDINATION OF CARE:  Labs Reviewed - No data to display Dg Knee Complete 4 Views Left  08/18/2011  *RADIOLOGY REPORT*  Clinical Data: Knee injury  LEFT KNEE - COMPLETE 4+ VIEW  Comparison: 05/26/2011  Findings: Four views of the left knee submitted.  No acute fracture or subluxation.  No joint effusion.  No radiopaque foreign body. Mild prepatellar soft tissue swelling.  IMPRESSION: No acute fracture or subluxation. Mild prepatellar soft tissue swelling.  Original Report Authenticated By: Natasha Mead, M.D.     1. Fall   2. Knee laceration     MDM  S/p mechanical fall with unk cause, knee laceration s/p repair. Will discharge home with PMD f/u suture removal 7-10 days. Precautions for return.  I personally performed the  services described in this documentation, which was scribed in my presence. The recorded information has been reviewed and considered.      Forbes Cellar, MD 08/18/11 [phone]

## 2011-08-19 ENCOUNTER — Other Ambulatory Visit (INDEPENDENT_AMBULATORY_CARE_PROVIDER_SITE_OTHER): Payer: Self-pay | Admitting: General Surgery

## 2011-08-19 DIAGNOSIS — C50919 Malignant neoplasm of unspecified site of unspecified female breast: Secondary | ICD-10-CM

## 2011-08-20 ENCOUNTER — Telehealth: Payer: Self-pay | Admitting: *Deleted

## 2011-08-20 NOTE — Telephone Encounter (Signed)
Left vm for pt to return call to schedule new pt med onc appt.

## 2011-08-20 NOTE — Telephone Encounter (Signed)
Confirmed appt with Dr. Darnelle Catalan on 08/21/11 at 4:30 for new breast cancer visit.  Gave instructions and contact information.

## 2011-08-21 ENCOUNTER — Other Ambulatory Visit: Payer: Medicare Other | Admitting: Lab

## 2011-08-21 ENCOUNTER — Ambulatory Visit: Payer: Medicare Other

## 2011-08-21 ENCOUNTER — Ambulatory Visit (HOSPITAL_BASED_OUTPATIENT_CLINIC_OR_DEPARTMENT_OTHER): Payer: Medicare Other | Admitting: Oncology

## 2011-08-21 VITALS — BP 167/75 | HR 69 | Temp 98.3°F | Ht 59.0 in | Wt 122.9 lb

## 2011-08-21 DIAGNOSIS — E559 Vitamin D deficiency, unspecified: Secondary | ICD-10-CM

## 2011-08-21 DIAGNOSIS — C50919 Malignant neoplasm of unspecified site of unspecified female breast: Secondary | ICD-10-CM

## 2011-08-21 DIAGNOSIS — C50911 Malignant neoplasm of unspecified site of right female breast: Secondary | ICD-10-CM | POA: Insufficient documentation

## 2011-08-21 MED ORDER — LETROZOLE 2.5 MG PO TABS: 2.5000 mg | ORAL_TABLET | Freq: Every day | ORAL | Status: AC

## 2011-08-21 NOTE — Progress Notes (Signed)
Jasmine Williams  DOB: 06/08/1941  MR#: [id number]  CSN#: [id number]    History of present illness:   The patient is a 71 year old [place] woman who had screening mammography at [facility] in December 2012, showing a possible abnormality in the right breast. She was referred to the breast Center, where right diagnostic mammography 07/10/2011 showed 2 clusters of microcalcifications in the right breast. These were biopsied 07/17/2011, and showed ([id number]) ductal carcinoma in situ as well as invasive ductal carcinoma. The invasive tumor was a 100% estrogen receptor positive, 11% progesterone receptor positive, with an MIB-1 of 75% and HER-2 nonamplified of with a HER-2 : CEP17 ratio of 1.22.  On January 14 the patient underwent bilateral breast MRI at [facility] imaging. This confirmed 2 separate areas of enhancement in the lower right breast measuring a total of 5.6 cm. There were no other suspicious areas in either breast, no enlarged or suspicious lymph nodes, and no suspicious bony or upper hepatic abnormalities.  With this information the patient was referred to Dr. Dwain Sarna and after appropriate discussion she underwent right lumpectomy and sentinel lymph node sampling 08/05/2011. The pathology from this procedure ([id number]) confirmed a 4 mm invasive ductal carcinoma, grade 3, with 0 of 2 sentinel lymph nodes involved. HER-2 was repeated and was equivocal, with a ratio of 1.95. Margins were clear.  Past medical history:      Past Medical History  Diagnosis Date  . No pertinent past medical history    injury to the left knee x2 as discussed below  Early cataracts  Palpitations, not associated with other symptoms  Past surgical history:      Past Surgical History  Procedure Date  . Cholecystectomy   . Broken leg     right as a child  . Breast lumpectomy     Family history:     Family History  Problem Relation Age of Onset  . Cancer Sister     breast   the  patient's father died at the age of 72 with Alzheimer's disease. The patient's mother is currently 48 years old. The patient had no brothers. She had 2 sisters, one of whom was diagnosed with breast cancer in her mid 93s. She was not genetically tested as far as the patient is aware. There is no history of ovarian cancer or other breast cancers in the family  Gynecologic history: Menarche age 21, first pregnancy to term age 13, menopause in her early 19s. The patient is GX P3. She took Prempro for 3-5 years, stopping several years ago    Social history:   She used to work in the [employer] but is now retired. Her husband of 50 years, Jasmine Williams, present today, is a retired [place] policeman. The patient's sons Jasmine Williams and. Jasmine Williams of also were policemen. Jasmine Williams is now a [employer] in [place]. Jasmine Williams teaches criminal justice in [place]. The patient's daughter Jasmine Williams, 78, lives in [place] and is a housewife.     ADVANCED DIRECTIVES: not in place  Health maintenance:       History  Substance Use Topics  . Smoking status: Former Smoker    Quit date: 07/25/1971  . Smokeless tobacco: Not on file  . Alcohol Use: Yes     rare      Colonoscopy: never  PAP: "no longer"  Bone density: remote, reportedly normal  Cholesterol:   Review of systems: She did well with her surgery, without unusual pain, fever, dehiscence, bleeding, rash, or  swelling. She has some problems with her gums. A detailed review of systems was entirely negative except for palpitations, which sometimes wake her up at night, but are not associated with chest pain or pressure, shortness of breath, or any other symptoms. She admits to significant caffeine intake daily. In addition she fell twice at her church, both [place] [employer], in medium high heels. The first time she was carrying twocontainers of water, which may have compromised her balance. The second time she was not wearing anything but she did feel that 1  of her feet slipped a little. The falls occurred 3 months apart. There was no loss of consciousness sensation of nausea vomiting dizziness or gait imbalance. Her husband points out that she is known to have one leg shorter than the other secondary to a childhood accident  Allergies:    No Known Allergies  Medications:      No current outpatient prescriptions on file.    Physical exam:  Middle-aged white woman who appears anxious    Filed Vitals:   08/21/11 1637  BP: 167/75  Pulse: 69  Temp: 98.3 F (36.8 C)     Body mass index is 24.82 kg/(m^2).  ECOG PS: 0  Sclerae unicteric Oropharynx clear No peripheral adenopathy Lungs no rales or rhonchi Heart regular rate and rhythm, with 2 premature beats noted over a 60 second periodAbd benign MSK no focal spinal tenderness, no peripheral edema Neuro: The pupils are equal round and responsive to direct and indirect light; extraocular movements are full, motor is 5 over 5 for all motor groups tested, and there was no sensory level. She was slightly wobbly on the Romberg, but steady (a single step) on the pullback maneuver Breasts: The right breast is status post recent lumpectomy. The incisions are healing nicely, without dehiscence, erythema, or excessive in duration. The cosmetic result is good. The left breast is unremarkable   Lab results:    Labs obtained January 23 showed a normal complete blood count, normal chemistry profile including normal liver function tests, and a noninformative CA 27-29 at 25.  Studies:      Chest 2 View  07/31/2011   CHEST - 2 VIEW  Comparison: None.  Findings: The lungs are clear.  Mediastinal contours appear normal. The heart is within normal limits in size.  No bony abnormality is seen.  IMPRESSION: No active lung disease.  Original Report Authenticated By: Juline Patch, M.D.   08/18/2011 LEFT KNEE - COMPLETE 4+ VIEW  Comparison: 05/26/2011  Findings: Four views of the left knee submitted.  No acute  fracture or subluxation.  No joint effusion.  No radiopaque foreign body. Mild prepatellar soft tissue swelling.  IMPRESSION: No acute fracture or subluxation. Mild prepatellar soft tissue swelling.  Original Report Authenticated By: Natasha Mead, M.D.    Assessment: 71 year old [place] woman status post right lumpectomy and sentinel lymph node sampling January of 2013 for a T1a N0, stage I A invasive ductal carcinoma, grade 3, estrogen and progesterone receptor positive, with equivocal HER-2 amplification, and an elevated MIB-1.      Plan: We spent the better part of her hour-long visit today discussing her prognosis. She understands the biology of her tumor well. In general T1a N0 tumors have a very low risk of recurrence, less than 5% over 10 years, and anti-estrogen therapy is optional (as noted in NCCN guidelines). In her case I am concerned that this was a fast growing, aggressive though small tumor, HER-2  equivocal, and therefore at greater risk of metastasis. I would use an 8-10% risk of recurrence with local treatment only in her case.  Anti-estrogens can cut this risk in half. She is terrified of tamoxifen because of concerns regarding strokes and heart attacks, although she understands the risk is identical to that of taking PremPro, which she did for several years with no special concerns. We discussed the possible toxicities, side effects and complications of aromatase inhibitors, and I went ahead and wrote her a prescription for letrozole to be started (if she does make a decision to take antiestrogen therapy) as soon she completes radiation. She understands this medication not only will cut the risk of recurrence in half but also cut in half the risk of a new breast cancer developing in either breast, that risk being approximately 1% per year in her case.  Her concern with falls is not so much the knee injury but why did she fall. Someone told her this could be an early sign of stroke, which  does not seem likely to me. Certainly devastating diseases like ALS or MS could present in such an insidious way, but if so they will manifest themselves over time. I reassured her that her neurologic exam was normal and that she simply needs to be more careful of slipping whenever she goes to church.  She will return to see me in early June. She should have been on letrozole about 2 months by that time. If she is tolerating it well we will start seeing her on a every 6 month basis for the next 2 years, then yearly until she completes her 5 years of followup.  Lowella Dell MD 08/21/2011

## 2011-08-22 ENCOUNTER — Ambulatory Visit
Admission: RE | Admit: 2011-08-22 | Discharge: 2011-08-22 | Disposition: A | Discharge status: Home / Self Care | Payer: Medicare Other | Source: Ambulatory Visit | Attending: Radiation Oncology | Admitting: Radiation Oncology

## 2011-08-22 ENCOUNTER — Encounter: Payer: Self-pay | Admitting: Radiation Oncology

## 2011-08-22 ENCOUNTER — Telehealth: Payer: Self-pay | Admitting: Oncology

## 2011-08-22 VITALS — BP 152/78 | HR 86 | Temp 97.1°F | Resp 18 | Ht 60.0 in | Wt 120.2 lb

## 2011-08-22 DIAGNOSIS — C50919 Malignant neoplasm of unspecified site of unspecified female breast: Secondary | ICD-10-CM | POA: Insufficient documentation

## 2011-08-22 DIAGNOSIS — C50911 Malignant neoplasm of unspecified site of right female breast: Secondary | ICD-10-CM

## 2011-08-22 DIAGNOSIS — Z510 Encounter for antineoplastic radiation therapy: Secondary | ICD-10-CM | POA: Insufficient documentation

## 2011-08-22 NOTE — Telephone Encounter (Signed)
S/w the pt and she is aware of her June 2013 appts 

## 2011-08-22 NOTE — Progress Notes (Signed)
CC:   Jasmine Gosling, MD Lowella Dell, M.D.  DIAGNOSIS:  T1a N0 invasive ductal carcinoma of the right breast.  PREVIOUS INTERVENTIONS:  Right lumpectomy and sentinel lymph node biopsy on 08/05/2011 revealing a 0.4 cm invasive ductal carcinoma with associated ductal carcinoma in situ, 0/2 lymph nodes positive, ER/PR positive, HER2 equivocal.  HISTORY OF PRESENT ILLNESS:  Jasmine Williams is a pleasant 71 year old female who went in for a regular screening mammogram.  She was found to have 2 abnormalities in the right lower inner quadrant.  On biopsy, one was shown to be invasive ductal carcinoma and the other ductal carcinoma in situ.  Her MRI showed no evidence of disease within the left breast and no other disease within the right breast.  No enlarged lymph nodes were noted.  She met with Dr. Dwain Sarna and elected to have breast conservation.  She had a lumpectomy and sentinel lymph node biopsy on 08/05/2011.  She underwent local excision with negative margins.  She had 0/2 lymph nodes positive.  She has recovered well from her surgery. She is not on any pain medications.  She met with Dr. Darnelle Catalan who discussed antiestrogen therapy.  She was referred to me by Dr. Darnelle Catalan for consideration of radiation to her breast.  She is accompanied by her husband today.  She really had no breast-related complaints prior to her mammogram.  No personal history of breast cancer or any treatment for malignancy.  PAST MEDICAL HISTORY: 1. Status post tonsillectomy.  2.  Status post cholecystectomy.  3.     History of broken leg.  FAMILY HISTORY:  Her sister had breast cancer at 56 and survived that. No other family history of ovarian, colon, or breast cancer.  SOCIAL HISTORY:  She smoked for many years but quit in 1973.  She is retired.  Her daughter lives here in [place].  ALLERGIES:  No known drug allergies.  MEDICATIONS:  No medications.  REVIEW OF SYSTEMS:  As per the history of  present illness.  All other systems reviewed and found to be negative.  PHYSICAL EXAMINATION:  General:  She is a pleasant female in no distress, sitting comfortably on the exam room table.  Vital Signs: Weight 120 pounds.  Blood pressure 152/78, pulse 86, temperature 97.1. She is pleasant and appears her stated age.  IMPRESSION:  T1a N0 invasive ductal carcinoma of the right breast status post lumpectomy.  RECOMMENDATIONS:  I spoke with Ms. Mittleman at length regarding the diagnosis and options for treatment.  We discussed the equivalency in terms of survival between mastectomy and breast conservation.  We discussed the process of simulation and the placement of tattoos.  We discussed 30-33 treatments as an outpatient.  We discussed the equivalency of hypofractionation ([place]) versus standard fractionation.  We discussed acute and chronic side effects including but not limited to lung damage, skin redness, and fatigue.  She has signed informed consent and agreed to proceed forward.  We have scheduled her for simulation next week.  She will then be seen by Dr. Darnelle Catalan in consideration of antiestrogen therapy.  She will follow up with Dr. Dwain Sarna tomorrow.    ______________________________ [name], M.D. SW/MEDQ  D:  08/22/2011  T:  08/22/2011  Job:  [id number]

## 2011-08-22 NOTE — Progress Notes (Signed)
HERE TODAY FOR CONSULT RIGHT BREAST.  ER/PR POSITIVE.Marland KitchenMarland KitchenHER 2 NEU NEG.   HAD LUMPECTOMY.  NO NEW PROBLEMS

## 2011-08-23 ENCOUNTER — Encounter (INDEPENDENT_AMBULATORY_CARE_PROVIDER_SITE_OTHER): Payer: Self-pay | Admitting: General Surgery

## 2011-08-23 ENCOUNTER — Ambulatory Visit (INDEPENDENT_AMBULATORY_CARE_PROVIDER_SITE_OTHER): Payer: Medicare Other | Admitting: General Surgery

## 2011-08-23 VITALS — BP 134/76 | HR 68 | Temp 97.8°F | Resp 18 | Ht 60.0 in | Wt 122.0 lb

## 2011-08-23 DIAGNOSIS — Z09 Encounter for follow-up examination after completed treatment for conditions other than malignant neoplasm: Secondary | ICD-10-CM

## 2011-08-23 NOTE — Patient Instructions (Signed)

## 2011-08-23 NOTE — Progress Notes (Signed)
Subjective:     Patient ID: Jasmine Williams, female   DOB: 1941-05-25, 71 y.o.   MRN: [id number]  HPI This is a 71 year old female who underwent a lumpectomy and sentinel lymph node biopsy. She returns today doing well without any complaints. Her pathology shows 2 foci. Margins are negative. One of these is invasive ductal carcinoma measuring 4 mm. The other is ductal carcinoma in situ that is high-grade. She has 2 lymph nodes that are both benign. There is no lymphovascular invasion. This is grade 3. ER is positive at 100%. PR is positive at 11%. Proliferation index is 75% in her HER-2/neu was with an equivocal range. She comes in today for a postop visit.  Review of Systems     Objective:   Physical Exam Healing right breast incision and right axillary incision without infection    Assessment:     Stage I right breast cancer    Plan:     She's doing well after surgery. She due to begin radiation therapy soon. She is then going to begin on aromatase inhibitor. I told her I will see her back in one year as she is due to follow up with medical oncology in June. I gave her a prescription to the after breast cancer physical therapy class. I asked her to call me she needs anything before then.

## 2011-08-26 DIAGNOSIS — Z4802 Encounter for removal of sutures: Secondary | ICD-10-CM | POA: Diagnosis not present

## 2011-08-26 DIAGNOSIS — W19XXXA Unspecified fall, initial encounter: Secondary | ICD-10-CM | POA: Diagnosis not present

## 2011-08-27 ENCOUNTER — Ambulatory Visit
Admission: RE | Admit: 2011-08-27 | Discharge: 2011-08-27 | Disposition: A | Discharge status: Home / Self Care | Payer: Medicare Other | Source: Ambulatory Visit | Attending: Radiation Oncology | Admitting: Radiation Oncology

## 2011-08-27 DIAGNOSIS — C50911 Malignant neoplasm of unspecified site of right female breast: Secondary | ICD-10-CM

## 2011-08-27 DIAGNOSIS — C50919 Malignant neoplasm of unspecified site of unspecified female breast: Secondary | ICD-10-CM | POA: Diagnosis not present

## 2011-08-27 DIAGNOSIS — Z510 Encounter for antineoplastic radiation therapy: Secondary | ICD-10-CM | POA: Diagnosis not present

## 2011-08-27 NOTE — Progress Notes (Signed)
Name: Jasmine Williams   WNU:.[id number]  Date:  08/27/2011  DOB: Nov 21, 1940  Status:outpatient    DIAGNOSIS: Breast cancer.  CONSENT VERIFIED: yes   SET UP: Patient is setup supine   IMMOBILIZATION:  The following immobilization was used:Custom Moldable Pillow, breast board.   NARRATIVE: [name] was brought to the CT Simulation planning suite.  Identity was confirmed.  All relevant records and images related to the planned course of therapy were reviewed.  Then, the patient was positioned in a stable reproducible clinical set-up for radiation therapy.  Wires were placed to delineate the clinical extent of breast tissue. A wire was placed on the scar as well.  CT images were obtained.  An isocenter was placed. Skin markings were placed.  The CT images were loaded into the planning software where the target and avoidance structures were contoured.  The radiation prescription was entered and confirmed. The patient was discharged in stable condition and tolerated simulation well.    TREATMENT PLANNING NOTE:  Treatment planning then occurred. I have requested : MLC's, isodose plan, basic dose calculation

## 2011-08-27 NOTE — Progress Notes (Signed)
Met with patient to discuss RO billing.  Rad Tx: 16109 Extrl Beam  Dx: 174.9 Female Breast, NOS (excludes skin of breast T-173.5)  Attending Rad: Dr. Michell Heinrich

## 2011-08-28 ENCOUNTER — Encounter: Payer: Self-pay | Admitting: *Deleted

## 2011-08-28 NOTE — Progress Notes (Signed)
Mailed after appt letter to pt. 

## 2011-08-29 ENCOUNTER — Encounter (INDEPENDENT_AMBULATORY_CARE_PROVIDER_SITE_OTHER): Payer: Medicare Other | Admitting: General Surgery

## 2011-08-29 DIAGNOSIS — Z510 Encounter for antineoplastic radiation therapy: Secondary | ICD-10-CM | POA: Diagnosis not present

## 2011-08-29 DIAGNOSIS — C50919 Malignant neoplasm of unspecified site of unspecified female breast: Secondary | ICD-10-CM | POA: Diagnosis not present

## 2011-09-03 ENCOUNTER — Ambulatory Visit
Admission: RE | Admit: 2011-09-03 | Discharge: 2011-09-03 | Disposition: A | Discharge status: Home / Self Care | Payer: Medicare Other | Source: Ambulatory Visit | Attending: Radiation Oncology | Admitting: Radiation Oncology

## 2011-09-03 ENCOUNTER — Encounter: Payer: Self-pay | Admitting: Radiation Oncology

## 2011-09-03 DIAGNOSIS — Z510 Encounter for antineoplastic radiation therapy: Secondary | ICD-10-CM | POA: Diagnosis not present

## 2011-09-03 DIAGNOSIS — C50919 Malignant neoplasm of unspecified site of unspecified female breast: Secondary | ICD-10-CM | POA: Diagnosis not present

## 2011-09-03 NOTE — Progress Notes (Signed)
[facility] Radiation Oncology Simulation Verification Note   Name: Jasmine Williams MRN: [id number]   Date: 09/03/2011 DOB: [birth year]  Status:outpatient   DIAGNOSIS: Right breast cancer.   POSITION: Patient was placed in the supine position on the treatment machine.  Isocenter and MLCS were reviewed and treatment was approved.  NARRATIVE: Patient tolerated simulation well.

## 2011-09-04 ENCOUNTER — Ambulatory Visit
Admission: RE | Admit: 2011-09-04 | Discharge: 2011-09-04 | Disposition: A | Discharge status: Home / Self Care | Payer: Medicare Other | Source: Ambulatory Visit | Attending: Radiation Oncology | Admitting: Radiation Oncology

## 2011-09-04 DIAGNOSIS — Z510 Encounter for antineoplastic radiation therapy: Secondary | ICD-10-CM | POA: Diagnosis not present

## 2011-09-04 DIAGNOSIS — C50919 Malignant neoplasm of unspecified site of unspecified female breast: Secondary | ICD-10-CM | POA: Diagnosis not present

## 2011-09-05 ENCOUNTER — Ambulatory Visit
Admission: RE | Admit: 2011-09-05 | Discharge: 2011-09-05 | Disposition: A | Discharge status: Home / Self Care | Payer: Medicare Other | Source: Ambulatory Visit | Attending: Radiation Oncology | Admitting: Radiation Oncology

## 2011-09-05 DIAGNOSIS — C50919 Malignant neoplasm of unspecified site of unspecified female breast: Secondary | ICD-10-CM | POA: Diagnosis not present

## 2011-09-05 DIAGNOSIS — Z510 Encounter for antineoplastic radiation therapy: Secondary | ICD-10-CM | POA: Diagnosis not present

## 2011-09-06 ENCOUNTER — Ambulatory Visit
Admission: RE | Admit: 2011-09-06 | Discharge: 2011-09-06 | Disposition: A | Discharge status: Home / Self Care | Payer: Medicare Other | Source: Ambulatory Visit | Attending: Radiation Oncology | Admitting: Radiation Oncology

## 2011-09-06 DIAGNOSIS — C50919 Malignant neoplasm of unspecified site of unspecified female breast: Secondary | ICD-10-CM | POA: Diagnosis not present

## 2011-09-06 DIAGNOSIS — Z510 Encounter for antineoplastic radiation therapy: Secondary | ICD-10-CM | POA: Diagnosis not present

## 2011-09-09 ENCOUNTER — Ambulatory Visit
Admission: RE | Admit: 2011-09-09 | Discharge: 2011-09-09 | Disposition: A | Discharge status: Home / Self Care | Payer: Medicare Other | Source: Ambulatory Visit | Attending: Radiation Oncology | Admitting: Radiation Oncology

## 2011-09-09 DIAGNOSIS — C50919 Malignant neoplasm of unspecified site of unspecified female breast: Secondary | ICD-10-CM | POA: Diagnosis not present

## 2011-09-09 DIAGNOSIS — Z510 Encounter for antineoplastic radiation therapy: Secondary | ICD-10-CM | POA: Diagnosis not present

## 2011-09-10 ENCOUNTER — Ambulatory Visit
Admission: RE | Admit: 2011-09-10 | Discharge: 2011-09-10 | Disposition: A | Discharge status: Home / Self Care | Payer: Medicare Other | Source: Ambulatory Visit | Attending: Radiation Oncology | Admitting: Radiation Oncology

## 2011-09-10 ENCOUNTER — Encounter: Payer: Self-pay | Admitting: Radiation Oncology

## 2011-09-10 DIAGNOSIS — C50919 Malignant neoplasm of unspecified site of unspecified female breast: Secondary | ICD-10-CM | POA: Diagnosis not present

## 2011-09-10 DIAGNOSIS — Z510 Encounter for antineoplastic radiation therapy: Secondary | ICD-10-CM | POA: Diagnosis not present

## 2011-09-10 DIAGNOSIS — C50911 Malignant neoplasm of unspecified site of right female breast: Secondary | ICD-10-CM

## 2011-09-10 MED ORDER — RADIAPLEXRX EX GEL
Freq: Once | CUTANEOUS | Status: DC
Start: 2011-09-10 — End: 2011-09-11

## 2011-09-10 MED ORDER — ALRA NON-METALLIC DEODORANT (RAD-ONC)
1.0000 "application " | Freq: Once | TOPICAL | Status: AC
  Administered 2011-09-10: 1 via TOPICAL

## 2011-09-10 NOTE — Progress Notes (Signed)
NO C/O, WILL DO POST SIM TEACHING WITH PT TODAY WHEN YOU FINISH.

## 2011-09-10 NOTE — Progress Notes (Signed)
DIAGNOSIS:  Right breast cancer.  NARRATIVE:  Jasmine Williams is seen today for weekly assessment.  She has completed 5/21 planned treatments directed at the right breast area (1335 cGy of a planned 5272 cGy).  The patient is tolerating her treatments well thus far without any significant fatigue, itching or discomfort in the breast area.  EXAMINATION:  The lungs are clear.  The heart has a regular rhythm and rate.  Examination of the right breast area reveals some mild erythema.  IMPRESSION AND PLAN:  The patient is tolerating her radiation treatments well thus far.  The patient's radiation fields are setting up accurately.  The patient's radiation chart was checked today.  Plan is to continue with hypofractionated accelerated radiation therapy to a cumulative dose of 5272 cGy.    ______________________________ Billie Lade, Ph.D., M.D. JDK/MEDQ  D:  09/10/2011  T:  09/10/2011  Job:  [id number]

## 2011-09-11 ENCOUNTER — Ambulatory Visit
Admission: RE | Admit: 2011-09-11 | Discharge: 2011-09-11 | Disposition: A | Discharge status: Home / Self Care | Payer: Medicare Other | Source: Ambulatory Visit | Attending: Radiation Oncology | Admitting: Radiation Oncology

## 2011-09-11 DIAGNOSIS — C50919 Malignant neoplasm of unspecified site of unspecified female breast: Secondary | ICD-10-CM | POA: Diagnosis not present

## 2011-09-11 DIAGNOSIS — Z510 Encounter for antineoplastic radiation therapy: Secondary | ICD-10-CM | POA: Diagnosis not present

## 2011-09-12 ENCOUNTER — Ambulatory Visit
Admission: RE | Admit: 2011-09-12 | Discharge: 2011-09-12 | Disposition: A | Discharge status: Home / Self Care | Payer: Medicare Other | Source: Ambulatory Visit | Attending: Radiation Oncology | Admitting: Radiation Oncology

## 2011-09-12 DIAGNOSIS — Z510 Encounter for antineoplastic radiation therapy: Secondary | ICD-10-CM | POA: Diagnosis not present

## 2011-09-12 DIAGNOSIS — C50919 Malignant neoplasm of unspecified site of unspecified female breast: Secondary | ICD-10-CM | POA: Diagnosis not present

## 2011-09-13 ENCOUNTER — Ambulatory Visit
Admission: RE | Admit: 2011-09-13 | Discharge: 2011-09-13 | Disposition: A | Discharge status: Home / Self Care | Payer: Medicare Other | Source: Ambulatory Visit | Attending: Radiation Oncology | Admitting: Radiation Oncology

## 2011-09-13 DIAGNOSIS — C50919 Malignant neoplasm of unspecified site of unspecified female breast: Secondary | ICD-10-CM | POA: Diagnosis not present

## 2011-09-13 DIAGNOSIS — Z510 Encounter for antineoplastic radiation therapy: Secondary | ICD-10-CM | POA: Diagnosis not present

## 2011-09-16 ENCOUNTER — Ambulatory Visit
Admission: RE | Admit: 2011-09-16 | Discharge: 2011-09-16 | Disposition: A | Discharge status: Home / Self Care | Payer: Medicare Other | Source: Ambulatory Visit | Attending: Radiation Oncology | Admitting: Radiation Oncology

## 2011-09-16 DIAGNOSIS — Z510 Encounter for antineoplastic radiation therapy: Secondary | ICD-10-CM | POA: Diagnosis not present

## 2011-09-16 DIAGNOSIS — C50919 Malignant neoplasm of unspecified site of unspecified female breast: Secondary | ICD-10-CM | POA: Diagnosis not present

## 2011-09-17 ENCOUNTER — Ambulatory Visit
Admission: RE | Admit: 2011-09-17 | Discharge: 2011-09-17 | Disposition: A | Discharge status: Home / Self Care | Payer: Medicare Other | Source: Ambulatory Visit | Attending: Radiation Oncology | Admitting: Radiation Oncology

## 2011-09-17 DIAGNOSIS — C50919 Malignant neoplasm of unspecified site of unspecified female breast: Secondary | ICD-10-CM | POA: Diagnosis not present

## 2011-09-17 DIAGNOSIS — C50911 Malignant neoplasm of unspecified site of right female breast: Secondary | ICD-10-CM

## 2011-09-17 DIAGNOSIS — Z510 Encounter for antineoplastic radiation therapy: Secondary | ICD-10-CM | POA: Diagnosis not present

## 2011-09-17 NOTE — Progress Notes (Signed)
Weekly Management Note Current Dose:  26.7 Gy  Projected Dose: 52.72 Gy   Narrative:  The patient presents for routine under treatment assessment.  CBCT/MVCT images/Port film x-rays were reviewed.  The chart was checked. No complaints. Questions about when to start tamoxifen. Using radiaplex.   Physical Findings:Slightly pink right breast.    Impression:  The patient is tolerating radiation.  Plan:  Continue treatment as planned. Discussed starting tam 1-7 days after last RT.  Discussed her set up films and fractionation.

## 2011-09-17 NOTE — Progress Notes (Signed)
HERE TODAY FOR PUT.  NO C/O, SKIN BRIGHT PINK WITH NO DESQUAMATION NOTED

## 2011-09-18 ENCOUNTER — Ambulatory Visit
Admission: RE | Admit: 2011-09-18 | Discharge: 2011-09-18 | Disposition: A | Discharge status: Home / Self Care | Payer: Medicare Other | Source: Ambulatory Visit | Attending: Radiation Oncology | Admitting: Radiation Oncology

## 2011-09-18 DIAGNOSIS — Z510 Encounter for antineoplastic radiation therapy: Secondary | ICD-10-CM | POA: Diagnosis not present

## 2011-09-18 DIAGNOSIS — C50919 Malignant neoplasm of unspecified site of unspecified female breast: Secondary | ICD-10-CM | POA: Diagnosis not present

## 2011-09-19 ENCOUNTER — Ambulatory Visit
Admission: RE | Admit: 2011-09-19 | Discharge: 2011-09-19 | Disposition: A | Discharge status: Home / Self Care | Payer: Medicare Other | Source: Ambulatory Visit | Attending: Radiation Oncology | Admitting: Radiation Oncology

## 2011-09-19 DIAGNOSIS — Z510 Encounter for antineoplastic radiation therapy: Secondary | ICD-10-CM | POA: Diagnosis not present

## 2011-09-19 DIAGNOSIS — C50919 Malignant neoplasm of unspecified site of unspecified female breast: Secondary | ICD-10-CM | POA: Diagnosis not present

## 2011-09-20 ENCOUNTER — Encounter: Payer: Self-pay | Admitting: Radiation Oncology

## 2011-09-20 ENCOUNTER — Ambulatory Visit
Admission: RE | Admit: 2011-09-20 | Discharge: 2011-09-20 | Disposition: A | Discharge status: Home / Self Care | Payer: Medicare Other | Source: Ambulatory Visit | Attending: Radiation Oncology | Admitting: Radiation Oncology

## 2011-09-20 DIAGNOSIS — C50911 Malignant neoplasm of unspecified site of right female breast: Secondary | ICD-10-CM

## 2011-09-20 DIAGNOSIS — C50919 Malignant neoplasm of unspecified site of unspecified female breast: Secondary | ICD-10-CM | POA: Diagnosis not present

## 2011-09-20 DIAGNOSIS — Z510 Encounter for antineoplastic radiation therapy: Secondary | ICD-10-CM | POA: Diagnosis not present

## 2011-09-20 NOTE — Progress Notes (Signed)
Name: Jasmine Williams   MRN: [id number]  Date:  09/20/2011   DOB: 1941-02-10  Status:outpatient    DIAGNOSIS: Breast cancer.  CONSENT VERIFIED: yes   SET UP: Patient is setup supine   IMMOBILIZATION:  The following immobilization was used:Custom Moldable Pillow, breast board.   NARRATIVE: [name] underwent complex simulation and treatment planning for her boost treatment today.  Her tumor volume was outlined on the planning CT scan. The dept of her cavity was 6 Cm.    18  MeV electrons will be prescribed to the 90% Isodose line.   A block will be used for beam modification purposes.  A special port plan is requested.

## 2011-09-23 ENCOUNTER — Ambulatory Visit
Admission: RE | Admit: 2011-09-23 | Discharge: 2011-09-23 | Disposition: A | Discharge status: Home / Self Care | Payer: Medicare Other | Source: Ambulatory Visit | Attending: Radiation Oncology | Admitting: Radiation Oncology

## 2011-09-23 DIAGNOSIS — C50919 Malignant neoplasm of unspecified site of unspecified female breast: Secondary | ICD-10-CM | POA: Diagnosis not present

## 2011-09-23 DIAGNOSIS — Z510 Encounter for antineoplastic radiation therapy: Secondary | ICD-10-CM | POA: Diagnosis not present

## 2011-09-24 ENCOUNTER — Ambulatory Visit
Admission: RE | Admit: 2011-09-24 | Discharge: 2011-09-24 | Disposition: A | Discharge status: Home / Self Care | Payer: Medicare Other | Source: Ambulatory Visit | Attending: Radiation Oncology | Admitting: Radiation Oncology

## 2011-09-24 DIAGNOSIS — C50911 Malignant neoplasm of unspecified site of right female breast: Secondary | ICD-10-CM

## 2011-09-24 DIAGNOSIS — C50919 Malignant neoplasm of unspecified site of unspecified female breast: Secondary | ICD-10-CM | POA: Diagnosis not present

## 2011-09-24 DIAGNOSIS — Z510 Encounter for antineoplastic radiation therapy: Secondary | ICD-10-CM | POA: Diagnosis not present

## 2011-09-24 NOTE — Progress Notes (Signed)
Weekly Management Note Current Dose:  40.50 Gy  Projected Dose: 52.72 Gy   Narrative:  The patient presents for routine under treatment assessment.  CBCT/MVCT images/Port film x-rays were reviewed.  The chart was checked. Doing well. No complaints. Using radiaplex intermittently. Seen on treatment machine while verifying position of ebeam boost.   Physical Findings: Skin slightly pink.   Impression:  The patient is tolerating radiation.  Plan:  Continue treatment as planned. Continue radiaplex.

## 2011-09-24 NOTE — Progress Notes (Signed)
SEEN BY DR. WENTWORTH 

## 2011-09-25 ENCOUNTER — Ambulatory Visit
Admission: RE | Admit: 2011-09-25 | Discharge: 2011-09-25 | Disposition: A | Discharge status: Home / Self Care | Payer: Medicare Other | Source: Ambulatory Visit | Attending: Radiation Oncology | Admitting: Radiation Oncology

## 2011-09-25 DIAGNOSIS — C50919 Malignant neoplasm of unspecified site of unspecified female breast: Secondary | ICD-10-CM | POA: Diagnosis not present

## 2011-09-25 DIAGNOSIS — Z510 Encounter for antineoplastic radiation therapy: Secondary | ICD-10-CM | POA: Diagnosis not present

## 2011-09-26 ENCOUNTER — Ambulatory Visit
Admission: RE | Admit: 2011-09-26 | Discharge: 2011-09-26 | Disposition: A | Discharge status: Home / Self Care | Payer: Medicare Other | Source: Ambulatory Visit | Attending: Radiation Oncology | Admitting: Radiation Oncology

## 2011-09-26 DIAGNOSIS — Z510 Encounter for antineoplastic radiation therapy: Secondary | ICD-10-CM | POA: Diagnosis not present

## 2011-09-26 DIAGNOSIS — C50919 Malignant neoplasm of unspecified site of unspecified female breast: Secondary | ICD-10-CM | POA: Diagnosis not present

## 2011-09-27 ENCOUNTER — Ambulatory Visit
Admission: RE | Admit: 2011-09-27 | Discharge: 2011-09-27 | Disposition: A | Discharge status: Home / Self Care | Payer: Medicare Other | Source: Ambulatory Visit | Attending: Radiation Oncology | Admitting: Radiation Oncology

## 2011-09-27 DIAGNOSIS — C50919 Malignant neoplasm of unspecified site of unspecified female breast: Secondary | ICD-10-CM | POA: Diagnosis not present

## 2011-09-27 DIAGNOSIS — Z510 Encounter for antineoplastic radiation therapy: Secondary | ICD-10-CM | POA: Diagnosis not present

## 2011-09-30 ENCOUNTER — Ambulatory Visit
Admission: RE | Admit: 2011-09-30 | Discharge: 2011-09-30 | Disposition: A | Discharge status: Home / Self Care | Payer: Medicare Other | Source: Ambulatory Visit | Attending: Radiation Oncology | Admitting: Radiation Oncology

## 2011-09-30 DIAGNOSIS — Z510 Encounter for antineoplastic radiation therapy: Secondary | ICD-10-CM | POA: Diagnosis not present

## 2011-09-30 DIAGNOSIS — C50919 Malignant neoplasm of unspecified site of unspecified female breast: Secondary | ICD-10-CM | POA: Diagnosis not present

## 2011-10-01 ENCOUNTER — Ambulatory Visit
Admission: RE | Admit: 2011-10-01 | Discharge: 2011-10-01 | Disposition: A | Discharge status: Home / Self Care | Payer: Medicare Other | Source: Ambulatory Visit | Attending: Radiation Oncology | Admitting: Radiation Oncology

## 2011-10-01 DIAGNOSIS — Z510 Encounter for antineoplastic radiation therapy: Secondary | ICD-10-CM | POA: Diagnosis not present

## 2011-10-01 DIAGNOSIS — Z0000 Encounter for general adult medical examination without abnormal findings: Secondary | ICD-10-CM | POA: Diagnosis not present

## 2011-10-01 DIAGNOSIS — C50911 Malignant neoplasm of unspecified site of right female breast: Secondary | ICD-10-CM

## 2011-10-01 DIAGNOSIS — C50919 Malignant neoplasm of unspecified site of unspecified female breast: Secondary | ICD-10-CM | POA: Diagnosis not present

## 2011-10-01 NOTE — Progress Notes (Signed)
Weekly Management Note Current Dose: 50.72  Gy  Projected Dose: 52.72 Gy   Narrative:  The patient presents for routine under treatment assessment.  CBCT/MVCT images/Port film x-rays were reviewed.  The chart was checked. Doing well. Some irritation came up in medial breast last week. Better now. Not iching.   Physical Findings: Dermatitis in UIQ.  Rest of breast pink. Inframammary folds clean.  Impression:  The patient is tolerating radiation.  Plan:  Continue treatment as planned. F/u in 1 month. Patient has f/u with magrinat in June. Has script for AI.

## 2011-10-01 NOTE — Progress Notes (Signed)
HERE FOR PUT TODAY.  NO C/O, DOES HAVE MODERATE AREA OF RADIATION DERMATITIS.  PT SAYS NOT REALLY ITCHING.

## 2011-10-02 ENCOUNTER — Encounter: Payer: Self-pay | Admitting: Radiation Oncology

## 2011-10-02 ENCOUNTER — Ambulatory Visit
Admission: RE | Admit: 2011-10-02 | Discharge: 2011-10-02 | Disposition: A | Discharge status: Home / Self Care | Payer: Medicare Other | Source: Ambulatory Visit | Attending: Radiation Oncology | Admitting: Radiation Oncology

## 2011-10-02 DIAGNOSIS — Z510 Encounter for antineoplastic radiation therapy: Secondary | ICD-10-CM | POA: Diagnosis not present

## 2011-10-02 DIAGNOSIS — C50919 Malignant neoplasm of unspecified site of unspecified female breast: Secondary | ICD-10-CM | POA: Diagnosis not present

## 2011-10-03 ENCOUNTER — Ambulatory Visit: Payer: Medicare Other

## 2011-10-04 ENCOUNTER — Ambulatory Visit: Payer: Medicare Other

## 2011-10-07 ENCOUNTER — Ambulatory Visit: Payer: Medicare Other

## 2011-10-08 ENCOUNTER — Ambulatory Visit: Payer: Medicare Other

## 2011-10-09 ENCOUNTER — Ambulatory Visit: Admission: RE | Admit: 2011-10-09 | Payer: Medicare Other | Source: Ambulatory Visit

## 2011-10-10 ENCOUNTER — Ambulatory Visit: Payer: Medicare Other

## 2011-10-11 ENCOUNTER — Ambulatory Visit: Payer: Medicare Other

## 2011-10-14 ENCOUNTER — Ambulatory Visit: Payer: Medicare Other

## 2011-10-15 ENCOUNTER — Ambulatory Visit: Payer: Medicare Other

## 2011-10-16 ENCOUNTER — Ambulatory Visit: Payer: Medicare Other

## 2011-10-17 ENCOUNTER — Ambulatory Visit: Payer: Medicare Other

## 2011-10-17 ENCOUNTER — Encounter: Payer: Self-pay | Admitting: Radiation Oncology

## 2011-10-17 NOTE — Progress Notes (Signed)
  Radiation Oncology         [phone] ________________________________  Name: Jasmine Williams MRN: [id number]  Date: 10/17/2011  DOB: [birth year]  End of Treatment Note  Diagnosis:   T1 N0 infiltrating ductal carcinoma of the right breast     Indication for treatment:  Curative       Radiation treatment dates:   09/04/2011 to  10/02/2011  Site/dose:   Right breast/42.72 gray at 2.67 gray per fraction x16 fractions   Right breast boost /10 gray at 2 gray per fraction x5 fractions  Beams/energy:   Opposed tangents with 6 MV photons En face electrons with 18 MeV electrons  Narrative: The patient tolerated radiation treatment relatively well.   She had very little in the way of skin toxicity. This was managed with Radiaplex.   Plan: The patient has completed radiation treatment. The patient will return to radiation oncology clinic for routine followup in one month. I advised them to call or return sooner if they have any questions or concerns related to their recovery or treatment.  ------------------------------------------------  [name], MD

## 2011-10-18 ENCOUNTER — Ambulatory Visit: Payer: Medicare Other

## 2011-10-31 ENCOUNTER — Ambulatory Visit: Payer: Medicare Other | Admitting: Radiation Oncology

## 2011-11-07 ENCOUNTER — Ambulatory Visit
Admission: RE | Admit: 2011-11-07 | Discharge: 2011-11-07 | Disposition: A | Discharge status: Home / Self Care | Payer: Medicare Other | Source: Ambulatory Visit | Attending: Radiation Oncology | Admitting: Radiation Oncology

## 2011-11-07 ENCOUNTER — Encounter: Payer: Self-pay | Admitting: Radiation Oncology

## 2011-11-07 VITALS — BP 146/89 | HR 89 | Temp 98.2°F | Resp 18 | Wt 124.2 lb

## 2011-11-07 DIAGNOSIS — C50911 Malignant neoplasm of unspecified site of right female breast: Secondary | ICD-10-CM

## 2011-11-07 NOTE — Progress Notes (Signed)
HERE TODAY FOR FU OF RIGHT BREAST CANCER.  STARTED [facility] April 5TH AND HAVING NO PROBLEMS WITH IT. SKIN LOOKS GREAT!   STILL FEELS A LITTLE TIRED EVERY ONCE IN A WHILE.

## 2011-11-07 NOTE — Progress Notes (Signed)
   Department of Radiation Oncology  Phone:  [phone] Fax:        [phone]   Name: Jasmine Williams   DOB: 03-03-1941  MRN: [id number]    Date: 11/07/2011  Follow Up Visit Note  CC: BURNETT,BRENT A, MD  Diagnosis: Right breast cancer  Interval since last radiation: 1 month  Allergies: No Known Allergies  Medications:  Current Outpatient Prescriptions  Medication Sig Dispense Refill  . letrozole (FEMARA) 2.5 MG tablet Take 2.5 mg by mouth daily.        Interval History: Jasmine Williams presents today for routine followup.  She is doing well. She is pleased with her cosmetic result.  Her areola peeled off after treatment but this was really it in terms of skin toxicity.  She is tolerating her femara well. Her energy levels are overall good although she has some occasional fatigue.   Physical Exam:   weight is 124 lb 3.2 oz (56.337 kg). Her oral temperature is 98.2 F (36.8 C). Her blood pressure is 146/89 and her pulse is 89. Her respiration is 18.  Excellent cosmetic result. Great symmetry. No skin changes  IMPRESSION: Right breast cancer with resolved acute effects of treatment  PLAN:  Jasmine Williams is a 71 y.o. female who has done well with her post radiation course.  She has follow up scheduled with Dr. Darnelle Catalan.  I will plan to see her on a prn basis. She knows she can call with any questions or concerns and we will be happy to see her.     Jasmine Hare, MD

## 2011-12-10 ENCOUNTER — Other Ambulatory Visit (HOSPITAL_BASED_OUTPATIENT_CLINIC_OR_DEPARTMENT_OTHER): Payer: Medicare Other | Admitting: Lab

## 2011-12-10 DIAGNOSIS — C50919 Malignant neoplasm of unspecified site of unspecified female breast: Secondary | ICD-10-CM | POA: Diagnosis not present

## 2011-12-10 DIAGNOSIS — E559 Vitamin D deficiency, unspecified: Secondary | ICD-10-CM | POA: Diagnosis not present

## 2011-12-10 DIAGNOSIS — C50911 Malignant neoplasm of unspecified site of right female breast: Secondary | ICD-10-CM

## 2011-12-10 LAB — CBC & DIFF AND RETIC
BASO%: 0.6 % (ref 0.0–2.0)
EOS%: 1.8 % (ref 0.0–7.0)
HGB: 14.3 g/dL (ref 11.6–15.9)
MCH: 28.5 pg (ref 25.1–34.0)
MCHC: 33.3 g/dL (ref 31.5–36.0)
RDW: 13.3 % (ref 11.2–14.5)
Retic %: 1.4 % (ref 0.70–2.10)
Retic Ct Abs: 70.28 10*3/uL (ref 33.70–90.70)
WBC: 6.6 10*3/uL (ref 3.9–10.3)
lymph#: 1.7 10*3/uL (ref 0.9–3.3)
nRBC: 0 % (ref 0–0)

## 2011-12-11 LAB — COMPREHENSIVE METABOLIC PANEL
AST: 17 U/L (ref 0–37)
Alkaline Phosphatase: 103 U/L (ref 39–117)
BUN: 13 mg/dL (ref 6–23)
Calcium: 9.6 mg/dL (ref 8.4–10.5)
Creatinine, Ser: 0.81 mg/dL (ref 0.50–1.10)
Total Bilirubin: 0.6 mg/dL (ref 0.3–1.2)

## 2011-12-17 ENCOUNTER — Ambulatory Visit (HOSPITAL_BASED_OUTPATIENT_CLINIC_OR_DEPARTMENT_OTHER): Payer: Medicare Other | Admitting: Oncology

## 2011-12-17 ENCOUNTER — Telehealth: Payer: Self-pay | Admitting: Oncology

## 2011-12-17 VITALS — BP 151/71 | HR 73 | Temp 98.1°F | Ht 60.0 in | Wt 124.0 lb

## 2011-12-17 DIAGNOSIS — Z170 Estrogen receptor positive status [ER+]: Secondary | ICD-10-CM | POA: Diagnosis not present

## 2011-12-17 DIAGNOSIS — C50919 Malignant neoplasm of unspecified site of unspecified female breast: Secondary | ICD-10-CM | POA: Diagnosis not present

## 2011-12-17 DIAGNOSIS — C50911 Malignant neoplasm of unspecified site of right female breast: Secondary | ICD-10-CM

## 2011-12-17 NOTE — Telephone Encounter (Signed)
gve the pt her mammo/bone density appt for dec and the oct 2013 appt calendar

## 2011-12-17 NOTE — Progress Notes (Signed)
Jasmine Williams  DOB: [birth year]  MR#: [id number]  CSN#: [id number]    History of present illness:   The patient is a 71 year old [place] woman who had screening mammography at [facility] in December 2012, showing a possible abnormality in the right breast. She was referred to the breast Center, where right diagnostic mammography 07/10/2011 showed 2 clusters of microcalcifications in the right breast. These were biopsied 07/17/2011, and showed ([id number]) ductal carcinoma in situ as well as invasive ductal carcinoma. The invasive tumor was a 100% estrogen receptor positive, 11% progesterone receptor positive, with an MIB-1 of 75% and HER-2 nonamplified of with a HER-2 : CEP17 ratio of 1.22.  On January 14 the patient underwent bilateral breast MRI at [facility] imaging. This confirmed 2 separate areas of enhancement in the lower right breast measuring a total of 5.6 cm. There were no other suspicious areas in either breast, no enlarged or suspicious lymph nodes, and no suspicious bony or upper hepatic abnormalities.  With this information the patient was referred to Dr. Dwain Sarna and after appropriate discussion she underwent right lumpectomy and sentinel lymph node sampling 08/05/2011. The pathology from this procedure ([id number]) confirmed a 4 mm invasive ductal carcinoma, grade 3, with 0 of 2 sentinel lymph nodes involved. HER-2 was repeated and was equivocal, with a ratio of 1.95. Margins were clear.  Her subsequent history is as detailed below.  Interval history: Since the last visit here [name] completed her radiation treatments, and started letrozole. So far she is tolerating that medication with no side effects whatsoever. She is getting it through [employer] at less than $2 per month.  Review of systems: She has had no unusual aches or pains, no hot flashes and no worsening problems with vaginal dryness. There has been no rash, bleeding, or complaints regarding the loss of hair or dry  skin. She has had no unusual headaches, visual changes, cough, phlegm production, pleurisy, or change in bowel or bladder habits. A detailed review of systems was entirely noncontributory.   Past Medical History  Diagnosis Date  . No pertinent past medical history   . Cancer     right breast   injury to the left knee x2 as discussed below  Early cataracts  Palpitations, not associated with other symptoms    Past Surgical History  Procedure Date  . Cholecystectomy   . Broken leg     right as a child  . Breast lumpectomy     right lump, snbx    Family history:     Family History  Problem Relation Age of Onset  . Cancer Sister     breast   the patient's father died at the age of 63 with Alzheimer's disease. The patient's mother is currently 83 years old. The patient had no brothers. She had 2 sisters, one of whom was diagnosed with breast cancer in her mid 74s. She was not genetically tested as far as the patient is aware. There is no history of ovarian cancer or other breast cancers in the family  Gynecologic history: Menarche age 33, first pregnancy to term age 10, menopause in her early 62s. The patient is GX P3. She took Prempro for 3-5 years, stopping several years ago    Social history:   She used to work in the [employer] but is now retired. Her husband of 50 years, Jasmine Williams,  is a retired [place] policeman. The patient's sons Jasmine Williams and. Jasmine Williams of also were policemen. Jasmine Williams is now a  [employer] in [place]. Jasmine Williams teaches criminal justice in [place]. The patient's daughter Jasmine Williams, 73, lives in [place] and is a housewife.     ADVANCED DIRECTIVES: not in place  Health maintenance:       History  Substance Use Topics  . Smoking status: Former Smoker    Quit date: 07/25/1971  . Smokeless tobacco: Not on file  . Alcohol Use: Yes     rare      Colonoscopy: never  PAP: "no longer"  Bone density: remote, reportedly normal  Cholesterol:    Allergies:     No Known Allergies  Medications:      Current Outpatient Prescriptions  Medication Sig Dispense Refill  . letrozole (FEMARA) 2.5 MG tablet Take 2.5 mg by mouth daily.        Physical exam:  Middle-aged white woman in no acute distress    Filed Vitals:   12/17/11 1107  BP: 151/71  Pulse: 73  Temp: 98.1 F (36.7 C)     Body mass index is 24.22 kg/(m^2).  ECOG PS: 0  Sclerae unicteric Oropharynx clear No cervical or supraclavicular adenopathy Lungs no rales or rhonchi Heart regular rate and rhythm, no murmur appreciated  Abdomen: Soft, nontender, positive bowel sounds, no organomegaly. MSK no focal spinal tenderness, no peripheral edema Neuro: nonfocal Breasts: The right breast is status post lumpectomy and radiation. There have been no skin changes from the radiation, particularly no hyperpigmentation or significant skin thickening. There is no nipple retraction. No evidence of local recurrence. The left breast is unremarkable  Lab results:    Results for MYRIKAL, MESSMER (MRN [id number]) as of 12/17/2011 11:48  Ref. Range 12/10/2011 13:18  Sodium Latest Range: 135-145 mEq/L 144  Potassium Latest Range: 3.5-5.3 mEq/L 4.1  Chloride Latest Range: 96-112 mEq/L 106  CO2 Latest Range: 19-32 mEq/L 27  BUN Latest Range: 6-23 mg/dL 13  Creat Latest Range: 0.50-1.10 mg/dL 7.82  Calcium Latest Range: 8.4-10.5 mg/dL 9.6  Glucose Latest Range: 70-99 mg/dL 98  Alkaline Phosphatase Latest Range: 39-117 U/L 103  Albumin Latest Range: 3.5-5.2 g/dL 4.3  AST Latest Range: 0-37 U/L 17  ALT Latest Range: 0-35 U/L 14  Total Protein Latest Range: 6.0-8.3 g/dL 7.1  Total Bilirubin Latest Range: 0.3-1.2 mg/dL 0.6  Vit D, 95-AOZHYQM Latest Range: 30-89 ng/mL 17 (L)  WBC Latest Range: 4.0-10.5 K/uL 6.6  RBC Latest Range: 3.87-5.11 MIL/uL 5.02  Hemoglobin Latest Range: 12.0-15.0 g/dL 57.8  HCT Latest Range: 36.0-46.0 % 43.0  MCV Latest Range: 78.0-100.0 fL 85.7  MCH Latest Range: 26.0-34.0 pg  28.5  MCHC Latest Range: 30.0-36.0 g/dL 46.9  RDW Latest Range: 11.5-15.5 % 13.3  Platelets Latest Range: 150-400 K/uL 314  NEUT% Latest Range: 38.4-76.8 % 64.0  LYMPH% Latest Range: 14.0-49.7 % 26.1  MONO% Latest Range: 0.0-14.0 % 7.5  EOS% Latest Range: 0.0-7.0 % 1.8  BASO% Latest Range: 0.0-2.0 % 0.6  NEUT# Latest Range: 1.5-6.5 10e3/uL 4.2  MONO# Latest Range: 0.1-0.9 10e3/uL 0.5  Eosinophils Absolute Latest Range: 0.0-0.5 10e3/uL 0.1  Basophils Absolute Latest Range: 0.0-0.1 10e3/uL 0.0  lymph# Latest Range: 0.9-3.3 10e3/uL 1.7  nRBC Latest Range: 0-0 % 0  Retic % Latest Range: 0.70-2.10 % 1.40  Retic Ct Abs Latest Range: 33.70-90.70 10e3/uL 70.28  Immature Retic Fract Latest Range: 1.60-10.00 % 5.40   Studies:    No new results found  Assessment: 71 y.o.  [place] woman status post right lumpectomy and sentinel lymph node sampling January of 2013  for a T1a N0, stage I A invasive ductal carcinoma, grade 3, estrogen and progesterone receptor positive, with equivocal HER-2 amplification, and an elevated MIB-1. She completed radiation therapy April 2013 and started letrozole at that time     Plan: She is doing very well with the letrozole, and has such a good overall prognosis, I would be comfortable seeing her on a once a year basis. Since she sees Dr. Doristine Counter in March, she will see [place] again in October and then yearly every october until she completes her 5 years of letrozole. I am adding a bone density to her mammography December of this year. If that is were some of we will of her urine to discuss Reclast. Otherwise she knows to call for any problems that may develop before the next visit.   Lowella Dell MD 12/17/2011

## 2011-12-20 ENCOUNTER — Telehealth: Payer: Self-pay | Admitting: Medical Oncology

## 2011-12-20 ENCOUNTER — Other Ambulatory Visit: Payer: Self-pay | Admitting: Medical Oncology

## 2011-12-20 NOTE — Telephone Encounter (Signed)
Received call from patient "Jasmine have been looking for this Vitamin D 500 mg cause Dr. Darnelle Catalan said Jasmine should start taking it and Jasmine can't find it anywhere.  All they have is 1000 mg and 400 mg."  Will review with MD.     Notified patient, per MD ok to take 1000 mg of Vitamin D.  Patient expressed understanding, no further questions at this time.

## 2012-04-14 ENCOUNTER — Other Ambulatory Visit (HOSPITAL_BASED_OUTPATIENT_CLINIC_OR_DEPARTMENT_OTHER): Payer: Medicare Other | Admitting: Lab

## 2012-04-14 DIAGNOSIS — C50911 Malignant neoplasm of unspecified site of right female breast: Secondary | ICD-10-CM

## 2012-04-14 DIAGNOSIS — C50919 Malignant neoplasm of unspecified site of unspecified female breast: Secondary | ICD-10-CM

## 2012-04-14 LAB — CBC WITH DIFFERENTIAL/PLATELET
Basophils Absolute: 0 10*3/uL (ref 0.0–0.1)
Eosinophils Absolute: 0.1 10*3/uL (ref 0.0–0.5)
HGB: 14.3 g/dL (ref 11.6–15.9)
MCV: 87.2 fL (ref 79.5–101.0)
MONO%: 5.6 % (ref 0.0–14.0)
NEUT#: 4.9 10*3/uL (ref 1.5–6.5)
Platelets: 318 10*3/uL (ref 145–400)
RDW: 13.4 % (ref 11.2–14.5)

## 2012-04-14 LAB — COMPREHENSIVE METABOLIC PANEL (CC13)
Albumin: 3.8 g/dL (ref 3.5–5.0)
Alkaline Phosphatase: 100 U/L (ref 40–150)
BUN: 12 mg/dL (ref 7.0–26.0)
Calcium: 9.9 mg/dL (ref 8.4–10.4)
Glucose: 102 mg/dl — ABNORMAL HIGH (ref 70–99)
Potassium: 4.2 mEq/L (ref 3.5–5.1)

## 2012-04-21 ENCOUNTER — Ambulatory Visit (HOSPITAL_BASED_OUTPATIENT_CLINIC_OR_DEPARTMENT_OTHER): Payer: Medicare Other | Admitting: Physician Assistant

## 2012-04-21 ENCOUNTER — Other Ambulatory Visit: Payer: Medicare Other | Admitting: Lab

## 2012-04-21 ENCOUNTER — Telehealth: Payer: Self-pay | Admitting: Oncology

## 2012-04-21 VITALS — BP 167/71 | HR 80 | Temp 98.3°F | Resp 20 | Ht 60.0 in | Wt 126.6 lb

## 2012-04-21 DIAGNOSIS — C50319 Malignant neoplasm of lower-inner quadrant of unspecified female breast: Secondary | ICD-10-CM

## 2012-04-21 DIAGNOSIS — Z170 Estrogen receptor positive status [ER+]: Secondary | ICD-10-CM | POA: Diagnosis not present

## 2012-04-21 DIAGNOSIS — E559 Vitamin D deficiency, unspecified: Secondary | ICD-10-CM

## 2012-04-21 DIAGNOSIS — C50911 Malignant neoplasm of unspecified site of right female breast: Secondary | ICD-10-CM

## 2012-04-21 MED ORDER — LETROZOLE 2.5 MG PO TABS: 2.5000 mg | ORAL_TABLET | Freq: Every day | ORAL | Status: DC

## 2012-04-21 NOTE — Telephone Encounter (Signed)
gve the pt her April 2014 appt calendar °

## 2012-04-21 NOTE — Progress Notes (Signed)
Jasmine Williams  DOB: March 23, 1941  MR#: [id number]  CSN#: [id number]    History of present illness:   The patient is a 71 year old [place] woman who had screening mammography at [facility] in December 2012, showing a possible abnormality in the right breast. She was referred to the breast Center, where right diagnostic mammography 07/10/2011 showed 2 clusters of microcalcifications in the right breast. These were biopsied 07/17/2011, and showed ([id number]) ductal carcinoma in situ as well as invasive ductal carcinoma. The invasive tumor was a 100% estrogen receptor positive, 11% progesterone receptor positive, with an MIB-1 of 75% and HER-2 nonamplified of with a HER-2 : CEP17 ratio of 1.22.  On January 14 the patient underwent bilateral breast MRI at [facility] imaging. This confirmed 2 separate areas of enhancement in the lower right breast measuring a total of 5.6 cm. There were no other suspicious areas in either breast, no enlarged or suspicious lymph nodes, and no suspicious bony or upper hepatic abnormalities.  With this information the patient was referred to Dr. Dwain Sarna and after appropriate discussion she underwent right lumpectomy and sentinel lymph node sampling 08/05/2011. The pathology from this procedure ([id number]) confirmed a 4 mm invasive ductal carcinoma, grade 3, with 0 of 2 sentinel lymph nodes involved. HER-2 was repeated and was equivocal, with a ratio of 1.95. Margins were clear.  Her subsequent history is as detailed below.  Interval history: Greenly returns today with her husband Jonny Ruiz for followup of her breast cancer. The interval history is unremarkable and in particular she is tolerating the letrozole with no side effects that she is aware of  Review of systems: There have been no hot flashes, no vaginal dryness, and no arthralgias or myalgias. She has had a little bit of a change in her reading prescription. She has some gum disease issues. Otherwise a detailed  review of systems today was entirely noncontributory.   Past Medical History  Diagnosis Date  . No pertinent past medical history   . Cancer     right breast   injury to the left knee x2 as discussed below  Early cataracts  Palpitations, not associated with other symptoms    Past Surgical History  Procedure Date  . Cholecystectomy   . Broken leg     right as a child  . Breast lumpectomy     right lump, snbx    Family history:     Family History  Problem Relation Age of Onset  . Cancer Sister     breast   the patient's father died at the age of 55 with Alzheimer's disease. The patient's mother is currently 36 years old. The patient had no brothers. She had 2 sisters, one of whom was diagnosed with breast cancer in her mid 44s. She was not genetically tested as far as the patient is aware. There is no history of ovarian cancer or other breast cancers in the family  Gynecologic history: Menarche age 46, first pregnancy to term age 33, menopause in her early 79s. The patient is GX P3. She took Prempro for 3-5 years, stopping several years ago    Social history:   She used to work in the [employer] but is now retired. Her husband of 50+ years, Jonny Ruiz,  is a retired [place] policeman. The patient's sons Homero Fellers and. Brett Canales of also were policemen. Homero Fellers is now a [employer] in [place]. Brett Canales teaches criminal justice in [place]. The patient's daughter Sheralyn Boatman, 47, lives in [place]  and is a housewife.     ADVANCED DIRECTIVES: not in place  Health maintenance:       History  Substance Use Topics  . Smoking status: Former Smoker    Quit date: 07/25/1971  . Smokeless tobacco: Not on file  . Alcohol Use: Yes     rare      Colonoscopy: never  PAP: "no longer"  Bone density: remote, reportedly normal  Cholesterol:    Allergies:    No Known Allergies  Medications:      Current Outpatient Prescriptions  Medication Sig Dispense Refill  . cholecalciferol  (VITAMIN D) 1000 UNITS tablet Take 1,000 Units by mouth daily.      Marland Kitchen letrozole (FEMARA) 2.5 MG tablet Take 1 tablet (2.5 mg total) by mouth daily.  90 tablet  12    Physical exam:  Middle-aged white woman who appears well    Filed Vitals:   04/21/12 1355  BP: 167/71  Pulse: 80  Temp: 98.3 F (36.8 C)  Resp: 20     Body mass index is 24.72 kg/(m^2).  ECOG PS: 0  Sclerae unicteric Oropharynx clear No cervical or supraclavicular adenopathy Lungs no rales or rhonchi Heart regular rate and rhythm, no murmur appreciated  Abdomen: Soft, nontender, positive bowel sounds, no organomegaly. MSK no focal spinal tenderness, no peripheral edema Neuro: nonfocal Breasts: The right breast is status post lumpectomy and radiation. There is no evidence of local recurrence. The left breast is unremarkable  Lab results:    CBC    Component Value Date/Time   WBC 6.9 04/14/2012 0928   WBC 7.6 07/31/2011 1000   RBC 5.00 04/14/2012 0928   RBC 4.88 07/31/2011 1000   HGB 14.3 04/14/2012 0928   HGB 15.7* 08/05/2011 1122   HCT 43.6 04/14/2012 0928   HCT 42.2 07/31/2011 1000   PLT 318 04/14/2012 0928   PLT 367 07/31/2011 1000   MCV 87.2 04/14/2012 0928   MCV 86.5 07/31/2011 1000   MCH 28.6 04/14/2012 0928   MCH 29.3 07/31/2011 1000   MCHC 32.8 04/14/2012 0928   MCHC 33.9 07/31/2011 1000   RDW 13.4 04/14/2012 0928   RDW 13.3 07/31/2011 1000   LYMPHSABS 1.6 04/14/2012 0928   MONOABS 0.4 04/14/2012 0928   EOSABS 0.1 04/14/2012 0928   BASOSABS 0.0 04/14/2012 0928     Chemistry      Component Value Date/Time   NA 142 04/14/2012 0928   NA 144 12/10/2011 1318   K 4.2 04/14/2012 0928   K 4.1 12/10/2011 1318   CL 106 04/14/2012 0928   CL 106 12/10/2011 1318   CO2 24 04/14/2012 0928   CO2 27 12/10/2011 1318   BUN 12.0 04/14/2012 0928   BUN 13 12/10/2011 1318   CREATININE 0.9 04/14/2012 0928   CREATININE 0.81 12/10/2011 1318      Component Value Date/Time   CALCIUM 9.9 04/14/2012 0928   CALCIUM 9.6 12/10/2011 1318   ALKPHOS 100  04/14/2012 0928   ALKPHOS 103 12/10/2011 1318   AST 17 04/14/2012 0928   AST 17 12/10/2011 1318   ALT 16 04/14/2012 0928   ALT 14 12/10/2011 1318   BILITOT 1.00 04/14/2012 0928   BILITOT 0.6 12/10/2011 1318       Studies:    No new results found Mammography and bone density are due in December.  Assessment: 71 y.o.  [place] woman status post right lumpectomy and sentinel lymph node sampling January of 2013 for a T1a N0, stage I  A invasive ductal carcinoma, grade 3, estrogen and progesterone receptor positive, with equivocal HER-2 amplification, and an elevated MIB-1. She completed radiation therapy April 2013 and started letrozole at that time     Plan: Jasmie is tolerating the letrozole well, and the plan is to continue that medication for 5 years. She is going to have her bone density in a couple of months, and she will call me a week later to discuss results. Today I urged her to take additional calcium, and to start exercising regularly.  I gave her information on the [employer] program. She is going to see my physician's assistant in April, and then she'll see me February of 2015. At that point we will start seeing her on a once a year basis. She knows to call for any problems that may develop before that visit.  Lowella Dell MD 04/21/2012

## 2012-06-23 ENCOUNTER — Ambulatory Visit
Admission: RE | Admit: 2012-06-23 | Discharge: 2012-06-23 | Disposition: A | Discharge status: Home / Self Care | Payer: Medicare Other | Source: Ambulatory Visit | Attending: Oncology | Admitting: Oncology

## 2012-06-23 DIAGNOSIS — M949 Disorder of cartilage, unspecified: Secondary | ICD-10-CM | POA: Diagnosis not present

## 2012-06-23 DIAGNOSIS — C50911 Malignant neoplasm of unspecified site of right female breast: Secondary | ICD-10-CM

## 2012-06-23 DIAGNOSIS — M899 Disorder of bone, unspecified: Secondary | ICD-10-CM | POA: Diagnosis not present

## 2012-06-23 DIAGNOSIS — Z853 Personal history of malignant neoplasm of breast: Secondary | ICD-10-CM | POA: Diagnosis not present

## 2012-08-18 ENCOUNTER — Other Ambulatory Visit: Payer: Medicare Other | Admitting: Lab

## 2012-08-25 ENCOUNTER — Ambulatory Visit: Payer: Medicare Other | Admitting: Oncology

## 2012-10-20 ENCOUNTER — Other Ambulatory Visit (HOSPITAL_BASED_OUTPATIENT_CLINIC_OR_DEPARTMENT_OTHER): Payer: Medicare Other | Admitting: Lab

## 2012-10-20 DIAGNOSIS — C50319 Malignant neoplasm of lower-inner quadrant of unspecified female breast: Secondary | ICD-10-CM | POA: Diagnosis not present

## 2012-10-20 DIAGNOSIS — C50911 Malignant neoplasm of unspecified site of right female breast: Secondary | ICD-10-CM

## 2012-10-20 DIAGNOSIS — E559 Vitamin D deficiency, unspecified: Secondary | ICD-10-CM

## 2012-10-20 LAB — CBC WITH DIFFERENTIAL/PLATELET
Basophils Absolute: 0.1 10*3/uL (ref 0.0–0.1)
Eosinophils Absolute: 0.1 10*3/uL (ref 0.0–0.5)
HGB: 13.7 g/dL (ref 11.6–15.9)
MCV: 87.2 fL (ref 79.5–101.0)
MONO#: 0.5 10*3/uL (ref 0.1–0.9)
NEUT#: 4.3 10*3/uL (ref 1.5–6.5)
RDW: 13.9 % (ref 11.2–14.5)
WBC: 6.9 10*3/uL (ref 3.9–10.3)
lymph#: 1.9 10*3/uL (ref 0.9–3.3)

## 2012-10-20 LAB — COMPREHENSIVE METABOLIC PANEL (CC13)
Albumin: 3.3 g/dL — ABNORMAL LOW (ref 3.5–5.0)
BUN: 13.7 mg/dL (ref 7.0–26.0)
CO2: 28 mEq/L (ref 22–29)
Calcium: 9.3 mg/dL (ref 8.4–10.4)
Chloride: 106 mEq/L (ref 98–107)
Glucose: 127 mg/dl — ABNORMAL HIGH (ref 70–99)
Potassium: 3.9 mEq/L (ref 3.5–5.1)
Total Protein: 7 g/dL (ref 6.4–8.3)

## 2012-10-21 ENCOUNTER — Telehealth: Payer: Self-pay | Admitting: *Deleted

## 2012-10-21 NOTE — Telephone Encounter (Signed)
sw pt informed her that AGB will be on pal 4/22. gv appt d/t for 11/06/12. Pt is aware...td

## 2012-10-27 ENCOUNTER — Ambulatory Visit: Payer: Medicare Other | Admitting: Physician Assistant

## 2012-11-06 ENCOUNTER — Telehealth: Payer: Self-pay | Admitting: Oncology

## 2012-11-06 ENCOUNTER — Ambulatory Visit (HOSPITAL_BASED_OUTPATIENT_CLINIC_OR_DEPARTMENT_OTHER): Payer: Medicare Other | Admitting: Physician Assistant

## 2012-11-06 ENCOUNTER — Encounter: Payer: Self-pay | Admitting: Physician Assistant

## 2012-11-06 VITALS — BP 161/80 | HR 81 | Temp 97.6°F | Resp 20 | Ht 60.0 in | Wt 122.9 lb

## 2012-11-06 DIAGNOSIS — Z853 Personal history of malignant neoplasm of breast: Secondary | ICD-10-CM

## 2012-11-06 DIAGNOSIS — Z170 Estrogen receptor positive status [ER+]: Secondary | ICD-10-CM | POA: Diagnosis not present

## 2012-11-06 DIAGNOSIS — E559 Vitamin D deficiency, unspecified: Secondary | ICD-10-CM | POA: Insufficient documentation

## 2012-11-06 DIAGNOSIS — C50319 Malignant neoplasm of lower-inner quadrant of unspecified female breast: Secondary | ICD-10-CM | POA: Diagnosis not present

## 2012-11-06 DIAGNOSIS — M810 Age-related osteoporosis without current pathological fracture: Secondary | ICD-10-CM | POA: Diagnosis not present

## 2012-11-06 DIAGNOSIS — C50911 Malignant neoplasm of unspecified site of right female breast: Secondary | ICD-10-CM

## 2012-11-06 MED ORDER — LETROZOLE 2.5 MG PO TABS: 2.5000 mg | ORAL_TABLET | Freq: Every day | ORAL | Status: DC

## 2012-11-06 NOTE — Telephone Encounter (Signed)
, °

## 2012-11-06 NOTE — Progress Notes (Signed)
ID: Jasmine Williams   DOB: 01/31/1941  MR#: [id number]  JYN#:[id number]  PCP: Delorse Lek, MD GYN:  SUEmelia Loron, MD OTHER MD:  [name], MD   HISTORY OF PRESENT ILLNESS: The patient is a 72 year old [place] woman who had screening mammography at [facility] in December 2012, showing a possible abnormality in the right breast. She was referred to the breast Center, where right diagnostic mammography 07/10/2011 showed 2 clusters of microcalcifications in the right breast. These were biopsied 07/17/2011, and showed ([id number]) ductal carcinoma in situ as well as invasive ductal carcinoma. The invasive tumor was a 100% estrogen receptor positive, 11% progesterone receptor positive, with an MIB-1 of 75% and HER-2 nonamplified of with a HER-2 : CEP17 ratio of 1.22.  On January 14 the patient underwent bilateral breast MRI at [facility]. This confirmed 2 separate areas of enhancement in the lower right breast measuring a total of 5.6 cm. There were no other suspicious areas in either breast, no enlarged or suspicious lymph nodes, and no suspicious bony or upper hepatic abnormalities.  With this information the patient was referred to Dr. Dwain Sarna and after appropriate discussion she underwent right lumpectomy and sentinel lymph node sampling 08/05/2011. The pathology from this procedure ([id number]) confirmed a 4 mm invasive ductal carcinoma, grade 3, with 0 of 2 sentinel lymph nodes involved. HER-2 was repeated and was equivocal, with a ratio of 1.95. Margins were clear.  Her subsequent history is as detailed below.  INTERVAL HISTORY: Jasmine Williams returns today for routine follow up of her right breast cancer.  Interval history is generally unremarkable and Jasmine Williams is feeling well.    She continues on letrozole which doesn't cause her any noticeable side effects.  She has no hot flashes, no vaginal dryness, and no increased joint pain.  A baseline bone density in  December 2013 showed osteopenia.  REVIEW OF SYSTEMS: Jasmine Williams has had no recent illnesses, fevers, chills, rashes, bruising, or abnormal bleeding.  Her energy level is good.  She has no problems with nausea and denies changes in bowel or bladder habits.  No cough, increased shortness of breath, chest pain, or palpitations.  She denies headaches or dizziness.  Currently, she has no pain and no peripheral swelling.  A detailed review of systems is otherwise noncontributory today.   PAST MEDICAL HISTORY: Past Medical History  Diagnosis Date  . No pertinent past medical history   . Cancer     right breast    PAST SURGICAL HISTORY: Past Surgical History  Procedure Laterality Date  . Cholecystectomy    . Broken leg      right as a child  . Breast lumpectomy      right lump, snbx    FAMILY HISTORY Family History  Problem Relation Age of Onset  . Cancer Sister     breast  the patient's father died at the age of 39 with Alzheimer's disease. The patient's mother is currently 41 years old. The patient had no brothers. She had 2 sisters, one of whom was diagnosed with breast cancer in her mid 82s. She was not genetically tested as far as the patient is aware. There is no history of ovarian cancer or other breast cancers in the family   GYNECOLOGIC HISTORY: Menarche age 43, first pregnancy to term age 29, menopause in her early 62s. The patient is GX P3. 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 Her husband of 50+ years, Jasmine Williams, is a retired [place] policeman. The patient's sons Jasmine Williams and. Jasmine Williams of also were policemen. Jasmine Williams is now a [employer] in [place]. Jasmine Williams teaches criminal justice in [place]. The patient's daughter Jasmine Williams, 72, lives in [place] and is a housewife.     ADVANCED DIRECTIVES: Not in place  HEALTH MAINTENANCE: History  Substance Use Topics  . Smoking status:  Former Smoker    Quit date: 07/25/1971  . Smokeless tobacco: Not on file  . Alcohol Use: Yes     Comment: rare     Colonoscopy: Never  PAP: "no longer"  Bone density: Dec 2013, osteopenia  Lipid panel:  No Known Allergies  Current Outpatient Prescriptions  Medication Sig Dispense Refill  . calcium carbonate (TUMS - DOSED IN MG ELEMENTAL CALCIUM) 500 MG chewable tablet Chew 2 tablets by mouth 2 (two) times daily.      . cholecalciferol (VITAMIN D) 1000 UNITS tablet Take 2,000 Units by mouth daily.       Marland Kitchen letrozole (FEMARA) 2.5 MG tablet Take 1 tablet (2.5 mg total) by mouth daily.  90 tablet  3   No current facility-administered medications for this visit.    OBJECTIVE: Middle aged white female who appears comfortable and is in no acute distress Filed Vitals:   11/06/12 1437  BP: 161/80  Pulse: 81  Temp: 97.6 F (36.4 C)  Resp: 20     Body mass index is 24 kg/(m^2).    ECOG FS: 0 Filed Weights   11/06/12 1437  Weight: 122 lb 14.4 oz (55.747 kg)   Sclerae unicteric Oropharynx clear No cervical or supraclavicular adenopathy Lungs clear to auscultation bilaterally, no wheezes, no rales or rhonchi Heart regular rate and rhythm Abdomen soft, nontender, positive bowel sounds MSK no focal spinal tenderness, no peripheral edema Neuro: nonfocal, well oriented, positive affect Breasts:  Right breast is status post lumpectomy.  No suspicious nodularity, skin changes, nipple inversion or evidence of recurrence.  Left breast is unremarkable.  Axillae are benign, no adenopathy palpated.   LAB RESULTS: Lab Results  Component Value Date   WBC 6.9 10/20/2012   NEUTROABS 4.3 10/20/2012   HGB 13.7 10/20/2012   HCT 41.4 10/20/2012   MCV 87.2 10/20/2012   PLT 331 10/20/2012      Chemistry      Component Value Date/Time   NA 144 10/20/2012 1449   NA 144 12/10/2011 1318   K 3.9 10/20/2012 1449   K 4.1 12/10/2011 1318   CL 106 10/20/2012 1449   CL 106 12/10/2011 1318   CO2 28 10/20/2012  1449   CO2 27 12/10/2011 1318   BUN 13.7 10/20/2012 1449   BUN 13 12/10/2011 1318   CREATININE 0.9 10/20/2012 1449   CREATININE 0.81 12/10/2011 1318      Component Value Date/Time   CALCIUM 9.3 10/20/2012 1449   CALCIUM 9.6 12/10/2011 1318   ALKPHOS 98 10/20/2012 1449   ALKPHOS 103 12/10/2011 1318   AST 16 10/20/2012 1449   AST 17 12/10/2011 1318   ALT 13 10/20/2012 1449   ALT 14 12/10/2011 1318   BILITOT 0.59 10/20/2012 1449   BILITOT 0.6 12/10/2011 1318     Vitamin D  27 10/20/2012    17 12/10/2011    STUDIES:  Most recent bilateral mammogram at the [facility] on 06/24/12 was unremarkable.  Most recent bone density at the Breast Center on 06/23/2012 showed some osteopenia.  ASSESSMENT: 72 y.o. [place] woman   (1)  status post right lumpectomy and sentinel lymph node sampling January of 2013 for a T1a N0, stage I A invasive ductal carcinoma, grade 3, estrogen and progesterone receptor positive, with equivocal HER-2 amplification, and an elevated MIB-1.   (2)  She completed radiation therapy April 2013 and started letrozole at that time    PLAN: Len appears to be doing well with regards to her breast cancer, with no clinical evidence of disease recurrence at this time.  She will continue on letrozole, our plan being to continue for a total of 5 years (until April 2018).    I have advised her to increase her vitamin D supplementation from 1000iU to 2000iU daily and continue her calcium supplements.  I also encouraged her to walk daily to decrease bone loss.  She would rather not start on bisphosphonate therapy at this time, but would like to try these other inventions first.  We will repeat her bone density study in December 2015 and re-assess.  Jasmine Williams will have her next mammogram in December 2014, and will see Dr. Darnelle Catalan in February 2015 for repeat labs and physical exam.  At that time, if all is well, we will begin seeing her on an annual basis.  Jasmine Williams voices understanding and  agreement with our plan today, and knows to call with any problems prior to her next appointment.  Jasmine Williams    11/06/2012

## 2012-11-09 DIAGNOSIS — H2510 Age-related nuclear cataract, unspecified eye: Secondary | ICD-10-CM | POA: Diagnosis not present

## 2013-06-25 ENCOUNTER — Ambulatory Visit
Admission: RE | Admit: 2013-06-25 | Discharge: 2013-06-25 | Disposition: A | Discharge status: Home / Self Care | Payer: Medicare Other | Source: Ambulatory Visit | Attending: Physician Assistant | Admitting: Physician Assistant

## 2013-06-25 DIAGNOSIS — Z853 Personal history of malignant neoplasm of breast: Secondary | ICD-10-CM | POA: Diagnosis not present

## 2013-08-02 ENCOUNTER — Other Ambulatory Visit: Payer: Self-pay | Admitting: Physician Assistant

## 2013-08-03 ENCOUNTER — Telehealth: Payer: Self-pay | Admitting: *Deleted

## 2013-08-03 NOTE — Telephone Encounter (Signed)
sw pt made her aware that on 2/16 Amy would like for her to come in @ 1:15pm. Pt is aware...td

## 2013-08-16 ENCOUNTER — Other Ambulatory Visit (HOSPITAL_BASED_OUTPATIENT_CLINIC_OR_DEPARTMENT_OTHER): Payer: Medicare Other

## 2013-08-16 DIAGNOSIS — C50319 Malignant neoplasm of lower-inner quadrant of unspecified female breast: Secondary | ICD-10-CM

## 2013-08-16 DIAGNOSIS — C50911 Malignant neoplasm of unspecified site of right female breast: Secondary | ICD-10-CM

## 2013-08-16 DIAGNOSIS — E559 Vitamin D deficiency, unspecified: Secondary | ICD-10-CM | POA: Diagnosis not present

## 2013-08-16 LAB — CBC WITH DIFFERENTIAL/PLATELET
BASO%: 0.4 % (ref 0.0–2.0)
BASOS ABS: 0 10*3/uL (ref 0.0–0.1)
EOS%: 1.3 % (ref 0.0–7.0)
Eosinophils Absolute: 0.1 10*3/uL (ref 0.0–0.5)
HEMATOCRIT: 42.7 % (ref 34.8–46.6)
HEMOGLOBIN: 13.8 g/dL (ref 11.6–15.9)
LYMPH#: 2.4 10*3/uL (ref 0.9–3.3)
LYMPH%: 29.8 % (ref 14.0–49.7)
MCH: 28.6 pg (ref 25.1–34.0)
MCHC: 32.3 g/dL (ref 31.5–36.0)
MCV: 88.4 fL (ref 79.5–101.0)
MONO#: 0.5 10*3/uL (ref 0.1–0.9)
MONO%: 6.2 % (ref 0.0–14.0)
NEUT#: 5.1 10*3/uL (ref 1.5–6.5)
NEUT%: 62.3 % (ref 38.4–76.8)
PLATELETS: 366 10*3/uL (ref 145–400)
RBC: 4.83 10*6/uL (ref 3.70–5.45)
RDW: 13.3 % (ref 11.2–14.5)
WBC: 8.2 10*3/uL (ref 3.9–10.3)

## 2013-08-16 LAB — COMPREHENSIVE METABOLIC PANEL (CC13)
ALT: 17 U/L (ref 0–55)
ANION GAP: 11 meq/L (ref 3–11)
AST: 17 U/L (ref 5–34)
Albumin: 3.8 g/dL (ref 3.5–5.0)
Alkaline Phosphatase: 78 U/L (ref 40–150)
BUN: 15 mg/dL (ref 7.0–26.0)
CALCIUM: 9.5 mg/dL (ref 8.4–10.4)
CHLORIDE: 105 meq/L (ref 98–109)
CO2: 29 meq/L (ref 22–29)
CREATININE: 0.8 mg/dL (ref 0.6–1.1)
Glucose: 104 mg/dl (ref 70–140)
Potassium: 4 mEq/L (ref 3.5–5.1)
Sodium: 144 mEq/L (ref 136–145)
Total Bilirubin: 0.43 mg/dL (ref 0.20–1.20)
Total Protein: 6.9 g/dL (ref 6.4–8.3)

## 2013-08-17 LAB — VITAMIN D 25 HYDROXY (VIT D DEFICIENCY, FRACTURES): Vit D, 25-Hydroxy: 38 ng/mL (ref 30–89)

## 2013-08-23 ENCOUNTER — Ambulatory Visit: Payer: Medicare Other | Admitting: Oncology

## 2013-08-23 ENCOUNTER — Encounter (INDEPENDENT_AMBULATORY_CARE_PROVIDER_SITE_OTHER): Payer: Self-pay

## 2013-08-23 ENCOUNTER — Encounter: Payer: Self-pay | Admitting: Physician Assistant

## 2013-08-23 ENCOUNTER — Telehealth: Payer: Self-pay | Admitting: Oncology

## 2013-08-23 ENCOUNTER — Ambulatory Visit (HOSPITAL_BASED_OUTPATIENT_CLINIC_OR_DEPARTMENT_OTHER): Payer: Medicare Other | Admitting: Physician Assistant

## 2013-08-23 VITALS — BP 154/80 | HR 101 | Temp 97.5°F | Resp 20 | Ht 60.0 in | Wt 111.7 lb

## 2013-08-23 DIAGNOSIS — C50919 Malignant neoplasm of unspecified site of unspecified female breast: Secondary | ICD-10-CM | POA: Diagnosis not present

## 2013-08-23 DIAGNOSIS — Z853 Personal history of malignant neoplasm of breast: Secondary | ICD-10-CM

## 2013-08-23 DIAGNOSIS — M949 Disorder of cartilage, unspecified: Secondary | ICD-10-CM | POA: Diagnosis not present

## 2013-08-23 DIAGNOSIS — M8580 Other specified disorders of bone density and structure, unspecified site: Secondary | ICD-10-CM | POA: Insufficient documentation

## 2013-08-23 DIAGNOSIS — E559 Vitamin D deficiency, unspecified: Secondary | ICD-10-CM

## 2013-08-23 DIAGNOSIS — M899 Disorder of bone, unspecified: Secondary | ICD-10-CM

## 2013-08-23 DIAGNOSIS — Z780 Asymptomatic menopausal state: Secondary | ICD-10-CM | POA: Insufficient documentation

## 2013-08-23 DIAGNOSIS — Z170 Estrogen receptor positive status [ER+]: Secondary | ICD-10-CM | POA: Diagnosis not present

## 2013-08-23 NOTE — Progress Notes (Signed)
ID: ALLE DIFABIO   DOB: [birth year]  MR#: [id number]  SHF#:[id number]  PCP: Stephens Shire, MD GYN:  SURolm Bookbinder, MD OTHER MD:  Thea Silversmith, MD  CHIEF COMPLAINT:  Hx of Right Breast Cancer   HISTORY OF PRESENT ILLNESS: The patient is a 73 year old [name] who had screening mammography at [facility] in December 2012, showing a possible abnormality in the right breast. She was referred to the breast Center, where right diagnostic mammography 07/10/2011 showed 2 clusters of microcalcifications in the right breast. These were biopsied 07/17/2011, and showed ([id number]) ductal carcinoma in situ as well as invasive ductal carcinoma. The invasive tumor was a 100% estrogen receptor positive, 11% progesterone receptor positive, with an MIB-1 of 75% and HER-2 nonamplified of with a HER-2 : CEP17 ratio of 1.22.  On January 14 the patient underwent bilateral breast MRI at [place]. This confirmed 2 separate areas of enhancement in the lower right breast measuring a total of 5.6 cm. There were no other suspicious areas in either breast, no enlarged or suspicious lymph nodes, and no suspicious bony or upper hepatic abnormalities.  With this information the patient was referred to Dr. Donne Hazel and after appropriate discussion she underwent right lumpectomy and sentinel lymph node sampling 08/05/2011. The pathology from this procedure ([id number]) confirmed a 4 mm invasive ductal carcinoma, grade 3, with 0 of 2 sentinel lymph nodes involved. HER-2 was repeated and was equivocal, with a ratio of 1.95. Margins were clear.  Her subsequent history is as detailed below.  INTERVAL HISTORY: Ariza returns alone today for routine follow up of her right breast cancer. Interval history is generally unremarkable.   Kiyana is feeling well, and continues on letrozole with good tolerance. Her energy level is good. In fact, she tells me she has had no side effects associated with  the letrozole. She's also thrilled that it is "so cheap". She's paying only a little over $2 for a 90 day supply!   REVIEW OF SYSTEMS: Leeandra has had no recent illnesses, fevers, chills, rashes, bruising, or abnormal bleeding. She's had no problems with hot flashes. She's noted no vaginal changes, specifically no dryness and no abnormal bleeding. Her appetite is good. She's had no problems with nausea or emesis and denies any change in bowel bladder habits. She denies any increased cough, phlegm production, shortness of breath, chest pain, palpitations, and has had no peripheral swelling. She's had no abnormal headaches or dizziness. She also denies any unusual myalgias, arthralgias, or bony pain.   A detailed review of systems is otherwise noncontributory today.   PAST MEDICAL HISTORY: Past Medical History  Diagnosis Date  . No pertinent past medical history   . Cancer     right breast    PAST SURGICAL HISTORY: Past Surgical History  Procedure Laterality Date  . Cholecystectomy    . Broken leg      right as a child  . Breast lumpectomy      right lump, snbx    FAMILY HISTORY Family History  Problem Relation Age of Onset  . Cancer Sister     breast  the patient's father died at the age of 66 with Alzheimer's disease. The patient's mother is currently 44 years old. The patient had no brothers. She had 2 sisters, one of whom was diagnosed with breast cancer in her mid 28s. She was not genetically tested as far as the patient is aware. There is no history of ovarian cancer or other  breast cancers in the family   GYNECOLOGIC HISTORY: Menarche age 41, first pregnancy to term age 37, menopause in her early 19s. The patient is GX P3. She took Prempro for 3-5 years, stopping several years ago  SOCIAL HISTORY:  (Updated February 2015) She used to work in the [employer] but is now retired. Her husband of 50+ years, [name], is a retired [place] policeman. The patient's sons [name] and. [name]  of also were policemen. [name] is now a [employer] in [place]. [name] teaches criminal justice in [place]. The patient's daughter [name], 57, lives in [place] and is a housewife.     ADVANCED DIRECTIVES: Not in place  HEALTH MAINTENANCE:  (Updated February 2015) History  Substance Use Topics  . Smoking status: Former Smoker    Quit date: 07/25/1971  . Smokeless tobacco: Not on file  . Alcohol Use: Yes     Comment: rare     Colonoscopy: Never  PAP: "no longer"  Bone density: Dec 2013, osteopenia  Lipid panel:  Not on file/[facility]  No Known Allergies  Current Outpatient Prescriptions  Medication Sig Dispense Refill  . calcium carbonate (TUMS - DOSED IN MG ELEMENTAL CALCIUM) 500 MG chewable tablet Chew 2 tablets by mouth 2 (two) times daily.      . cholecalciferol (VITAMIN D) 1000 UNITS tablet Take 2,000 Units by mouth daily.       Marland Kitchen letrozole (FEMARA) 2.5 MG tablet Take 1 tablet (2.5 mg total) by mouth daily.  90 tablet  3   No current facility-administered medications for this visit.    OBJECTIVE: Middle aged white female who appears well and is in no acute distress Filed Vitals:   08/23/13 1252  BP: 154/80  Pulse: 101  Temp: 97.5 F (36.4 C)  Resp: 20     Body mass index is 21.82 kg/(m^2).    ECOG FS: 0 Filed Weights   08/23/13 1252  Weight: 111 lb 11.2 oz (50.667 kg)   Physical Exam: HEENT:  Sclerae anicteric.  Oropharynx clear and moist. Neck supple, trachea midline. No thyromegaly.  NODES:  No cervical or supraclavicular lymphadenopathy palpated.  BREAST EXAM:  Right breast is status post lumpectomy with no suspicious nodularity or skin changes, and no evidence of local recurrence. Left breast is unremarkable. Axillae are benign bilaterally, with no palpable lymphadenopathy. LUNGS:  Clear to auscultation bilaterally with good excursion.  No wheezes or rhonchi HEART:  Regular rate and rhythm.  ABDOMEN:  Soft, thin, nontender.  Positive,  normoactive bowel sounds.  MSK:  No focal spinal tenderness to palpation. Good range of motion bilaterally in the upper extremities. EXTREMITIES:  No peripheral edema.   SKIN:  Benign with no visible rashes or skin lesions. No pallor. No ecchymoses or petechiae. NEURO:  Nonfocal. Well oriented.  Appropriate affect.    LAB RESULTS: Lab Results  Component Value Date   WBC 8.2 08/16/2013   NEUTROABS 5.1 08/16/2013   HGB 13.8 08/16/2013   HCT 42.7 08/16/2013   MCV 88.4 08/16/2013   PLT 366 08/16/2013      Chemistry      Component Value Date/Time   NA 144 08/16/2013 1352   NA 144 12/10/2011 1318   K 4.0 08/16/2013 1352   K 4.1 12/10/2011 1318   CL 106 10/20/2012 1449   CL 106 12/10/2011 1318   CO2 29 08/16/2013 1352   CO2 27 12/10/2011 1318   BUN 15.0 08/16/2013 1352   BUN 13 12/10/2011 1318  CREATININE 0.8 08/16/2013 1352   CREATININE 0.81 12/10/2011 1318      Component Value Date/Time   CALCIUM 9.5 08/16/2013 1352   CALCIUM 9.6 12/10/2011 1318   ALKPHOS 78 08/16/2013 1352   ALKPHOS 103 12/10/2011 1318   AST 17 08/16/2013 1352   AST 17 12/10/2011 1318   ALT 17 08/16/2013 1352   ALT 14 12/10/2011 1318   BILITOT 0.43 08/16/2013 1352   BILITOT 0.6 12/10/2011 1318     Vitamin D  38 08/16/2013    27 10/20/2012    17 12/10/2011    STUDIES:  Most recent bilateral mammogram at the [facility] on 06/25/2013 was unremarkable.  Most [facility] bone density at the [facility] on 06/23/2012 showed some osteopenia.    ASSESSMENT: 73 y.o.  woman   (1)  status post right lumpectomy and sentinel lymph node sampling January of 2013 for a T1a N0, stage I A invasive ductal carcinoma, grade 3, estrogen and progesterone receptor positive, with equivocal HER-2 amplification, and an elevated MIB-1.   (2)  She completed radiation therapy April 2013 and started letrozole at that time    PLAN: Constanza is doing well, and is tolerating the letrozole with no problems. I'm making no change to her current regimen, and I am  refilling her letrozole for another year. The plan is to continue for total of 5 years, until April 2018. Of course we'll continue to check her bone density every 2 years, with annual mammograms, both of which will be due in December 2015.  Otherwise, Tamzin will continue to see [place] on an annual basis for labs and physical exam. All this was reviewed in detail with her today and she voices understanding and agreement with our plan.   Aylene Acoff PA-C     08/23/2013

## 2013-08-23 NOTE — Telephone Encounter (Signed)
gv pt appt schedule for feb 2016 and appt for mammo/bone density dec 2015.

## 2013-11-16 DIAGNOSIS — H2510 Age-related nuclear cataract, unspecified eye: Secondary | ICD-10-CM | POA: Diagnosis not present

## 2013-12-27 ENCOUNTER — Other Ambulatory Visit: Payer: Self-pay | Admitting: *Deleted

## 2013-12-27 DIAGNOSIS — C50911 Malignant neoplasm of unspecified site of right female breast: Secondary | ICD-10-CM

## 2013-12-27 MED ORDER — LETROZOLE 2.5 MG PO TABS: 2.5000 mg | ORAL_TABLET | Freq: Every day | ORAL | Status: DC

## 2014-06-27 ENCOUNTER — Ambulatory Visit
Admission: RE | Admit: 2014-06-27 | Discharge: 2014-06-27 | Disposition: A | Discharge status: Home / Self Care | Payer: Medicare Other | Source: Ambulatory Visit | Attending: Physician Assistant | Admitting: Physician Assistant

## 2014-06-27 DIAGNOSIS — Z853 Personal history of malignant neoplasm of breast: Secondary | ICD-10-CM | POA: Diagnosis not present

## 2014-06-27 DIAGNOSIS — Z780 Asymptomatic menopausal state: Secondary | ICD-10-CM

## 2014-06-27 DIAGNOSIS — M85852 Other specified disorders of bone density and structure, left thigh: Secondary | ICD-10-CM | POA: Diagnosis not present

## 2014-06-27 DIAGNOSIS — M8580 Other specified disorders of bone density and structure, unspecified site: Secondary | ICD-10-CM

## 2014-06-27 DIAGNOSIS — E559 Vitamin D deficiency, unspecified: Secondary | ICD-10-CM

## 2014-06-27 DIAGNOSIS — M85832 Other specified disorders of bone density and structure, left forearm: Secondary | ICD-10-CM | POA: Diagnosis not present

## 2014-08-12 ENCOUNTER — Other Ambulatory Visit: Payer: Self-pay | Admitting: *Deleted

## 2014-08-12 DIAGNOSIS — C50911 Malignant neoplasm of unspecified site of right female breast: Secondary | ICD-10-CM

## 2014-08-15 ENCOUNTER — Other Ambulatory Visit (HOSPITAL_BASED_OUTPATIENT_CLINIC_OR_DEPARTMENT_OTHER): Payer: Medicare Other

## 2014-08-15 DIAGNOSIS — C50311 Malignant neoplasm of lower-inner quadrant of right female breast: Secondary | ICD-10-CM | POA: Diagnosis not present

## 2014-08-15 DIAGNOSIS — C50911 Malignant neoplasm of unspecified site of right female breast: Secondary | ICD-10-CM

## 2014-08-15 LAB — COMPREHENSIVE METABOLIC PANEL (CC13)
ALT: 13 U/L (ref 0–55)
AST: 17 U/L (ref 5–34)
Albumin: 3.7 g/dL (ref 3.5–5.0)
Alkaline Phosphatase: 109 U/L (ref 40–150)
Anion Gap: 12 mEq/L — ABNORMAL HIGH (ref 3–11)
BILIRUBIN TOTAL: 0.44 mg/dL (ref 0.20–1.20)
BUN: 12.5 mg/dL (ref 7.0–26.0)
CO2: 26 meq/L (ref 22–29)
Calcium: 9.4 mg/dL (ref 8.4–10.4)
Chloride: 105 mEq/L (ref 98–109)
Creatinine: 0.9 mg/dL (ref 0.6–1.1)
EGFR: 64 mL/min/{1.73_m2} — ABNORMAL LOW (ref >90)
Glucose: 145 mg/dl — ABNORMAL HIGH (ref 70–140)
POTASSIUM: 3.7 meq/L (ref 3.5–5.1)
Sodium: 143 mEq/L (ref 136–145)
Total Protein: 7.3 g/dL (ref 6.4–8.3)

## 2014-08-15 LAB — CBC WITH DIFFERENTIAL/PLATELET
BASO%: 1.3 % (ref 0.0–2.0)
Basophils Absolute: 0.1 10*3/uL (ref 0.0–0.1)
EOS%: 1.3 % (ref 0.0–7.0)
Eosinophils Absolute: 0.1 10*3/uL (ref 0.0–0.5)
HEMATOCRIT: 43.6 % (ref 34.8–46.6)
HGB: 13.8 g/dL (ref 11.6–15.9)
LYMPH%: 22.9 % (ref 14.0–49.7)
MCH: 28 pg (ref 25.1–34.0)
MCHC: 31.8 g/dL (ref 31.5–36.0)
MCV: 88.1 fL (ref 79.5–101.0)
MONO#: 0.5 10*3/uL (ref 0.1–0.9)
MONO%: 6 % (ref 0.0–14.0)
NEUT%: 68.5 % (ref 38.4–76.8)
NEUTROS ABS: 6.1 10*3/uL (ref 1.5–6.5)
Platelets: 484 10*3/uL — ABNORMAL HIGH (ref 145–400)
RBC: 4.95 10*6/uL (ref 3.70–5.45)
RDW: 13.9 % (ref 11.2–14.5)
WBC: 8.9 10*3/uL (ref 3.9–10.3)
lymph#: 2 10*3/uL (ref 0.9–3.3)

## 2014-08-22 ENCOUNTER — Telehealth: Payer: Self-pay | Admitting: Oncology

## 2014-08-22 ENCOUNTER — Ambulatory Visit: Payer: Medicare Other | Admitting: Oncology

## 2014-08-22 ENCOUNTER — Telehealth: Payer: Self-pay

## 2014-08-22 NOTE — Telephone Encounter (Signed)
Pt called to r/s appt w/GM due to weather. Message forwarded to scheduler. Told pt to expect a call today or tomorrow.

## 2014-08-22 NOTE — Telephone Encounter (Signed)
per pt moved from 2/15 to 3/23 due to weather - pt has new d/t

## 2014-09-28 ENCOUNTER — Ambulatory Visit (HOSPITAL_BASED_OUTPATIENT_CLINIC_OR_DEPARTMENT_OTHER): Payer: Medicare Other | Admitting: Oncology

## 2014-09-28 ENCOUNTER — Telehealth: Payer: Self-pay | Admitting: Oncology

## 2014-09-28 VITALS — BP 155/91 | HR 108 | Temp 97.8°F | Resp 18 | Ht 60.0 in | Wt 117.8 lb

## 2014-09-28 DIAGNOSIS — C50311 Malignant neoplasm of lower-inner quadrant of right female breast: Secondary | ICD-10-CM

## 2014-09-28 DIAGNOSIS — M8580 Other specified disorders of bone density and structure, unspecified site: Secondary | ICD-10-CM

## 2014-09-28 DIAGNOSIS — C50911 Malignant neoplasm of unspecified site of right female breast: Secondary | ICD-10-CM

## 2014-09-28 NOTE — Progress Notes (Signed)
ID: Jasmine Williams   DOB: [birth year]  MR#: [id number]  UUV#:[id number]  PCP: Stephens Shire, MD GYN:  SURolm Bookbinder, MD OTHER MD:  Thea Silversmith, MD  CHIEF COMPLAINT:  Estrogen receptor positive Right Breast Cancer  CURRENT TREATMENT: Letrozole   HISTORY OF PRESENT ILLNESS: From the original intake note:  The patient is a 74 year old [name] who had screening mammography at [facility] in December 2012, showing a possible abnormality in the right breast. She was referred to the breast Center, where right diagnostic mammography 07/10/2011 showed 2 clusters of microcalcifications in the right breast. These were biopsied 07/17/2011, and showed ([id number]) ductal carcinoma in situ as well as invasive ductal carcinoma. The invasive tumor was a 100% estrogen receptor positive, 11% progesterone receptor positive, with an MIB-1 of 75% and HER-2 nonamplified of with a HER-2 : CEP17 ratio of 1.22.   On January 14 the patient underwent bilateral breast MRI at [facility]. This confirmed 2 separate areas of enhancement in the lower right breast measuring a total of 5.6 cm. There were no other suspicious areas in either breast, no enlarged or suspicious lymph nodes, and no suspicious bony or upper hepatic abnormalities.  With this information the patient was referred to Dr. Donne Hazel and after appropriate discussion she underwent right lumpectomy and sentinel lymph node sampling 08/05/2011. The pathology from this procedure ([id number]) confirmed a 4 mm invasive ductal carcinoma, grade 3, with 0 of 2 sentinel lymph nodes involved. HER-2 was repeated and was equivocal, with a ratio of 1.95. Margins were clear.   Her subsequent history is as detailed below.  INTERVAL HISTORY: Shelbie returns today for follow-up of her low risk breast cancer. She continues on letrozole with excellent tolerance. She has no problems with hot flashes, vaginal dryness, or arthralgias/myalgias.  She gets the drug for less than $3 a month.  REVIEW OF SYSTEMS: Asked what her worse problem is, Jonae tells me she has no " worse" problems.. She exercises chiefly by walking. She stopped taking her vitamin D supplementation because she read or heard somewhere that supplements were not good for you. Aside from these issues a detailed review of systems today was noncontributory   PAST MEDICAL HISTORY: Past Medical History  Diagnosis Date  . No pertinent past medical history   . Cancer     right breast    PAST SURGICAL HISTORY: Past Surgical History  Procedure Laterality Date  . Cholecystectomy    . Broken leg      right as a child  . Breast lumpectomy      right lump, snbx    FAMILY HISTORY Family History  Problem Relation Age of Onset  . Cancer Sister     breast  the patient's father died at the age of 55 with Alzheimer's disease. The patient's mother is currently 30 years old. The patient had no brothers. She had 2 sisters, one of whom was diagnosed with breast cancer in her mid 34s. She was not genetically tested as far as the patient is aware. There is no history of ovarian cancer or other breast cancers in the family   GYNECOLOGIC HISTORY: Menarche age 59, first pregnancy to term age 70, menopause in her early 33s. The patient is GX P3. She took Prempro for 3-5 years, stopping several years ago  SOCIAL HISTORY:  (Updated February 2015) She used to work in the [employer] but is now retired. Her husband of 50+ years, [name], is a retired [place]  policeman. The patient's sons [name] and. [name] of also were policemen. [name] is now a [employer] in [place]. [name] teaches criminal justice in [place]. The patient's daughter [name], 32, lives in [place] and is a housewife.     ADVANCED DIRECTIVES: Not in place  HEALTH MAINTENANCE:  (Updated February 2015) History  Substance Use Topics  . Smoking status: Former Smoker    Quit date: 07/25/1971  . Smokeless  tobacco: Not on file  . Alcohol Use: Yes     Comment: rare     Colonoscopy: Never  PAP: "no longer"  Bone density: Dec 2013, osteopenia  Lipid panel:  Not on file/[facility]  No Known Allergies  Current Outpatient Prescriptions  Medication Sig Dispense Refill  . calcium carbonate (TUMS - DOSED IN MG ELEMENTAL CALCIUM) 500 MG chewable tablet Chew 2 tablets by mouth 2 (two) times daily.    . cholecalciferol (VITAMIN D) 1000 UNITS tablet Take 2,000 Units by mouth daily.     Marland Kitchen letrozole (FEMARA) 2.5 MG tablet Take 1 tablet (2.5 mg total) by mouth daily. 90 tablet 2   No current facility-administered medications for this visit.    OBJECTIVE: Middle aged white female who looks well Filed Vitals:   09/28/14 1349  BP: 155/91  Pulse: 108  Temp: 97.8 F (36.6 C)  Resp: 18     Body mass index is 23.01 kg/(m^2).    ECOG FS: 0 Filed Weights   09/28/14 1349  Weight: 117 lb 12.8 oz (53.434 kg)   Sclerae unicteric, pupils equal and reactive Oropharynx clear and moist-- no thrush No cervical or supraclavicular adenopathy Lungs no rales or rhonchi Heart regular rate and rhythm Abd soft, nontender, positive bowel sounds MSK no focal spinal tenderness, no upper extremity lymphedema Neuro: nonfocal, well oriented, appropriate affect Breasts: The right breast is status post lumpectomy and radiation. There is no evidence of local recurrence. The right axilla is benign per the left breast is unremarkable.     LAB RESULTS: Lab Results  Component Value Date   WBC 8.9 08/15/2014   NEUTROABS 6.1 08/15/2014   HGB 13.8 08/15/2014   HCT 43.6 08/15/2014   MCV 88.1 08/15/2014   PLT 484* 08/15/2014      Chemistry      Component Value Date/Time   NA 143 08/15/2014 1313   NA 144 12/10/2011 1318   K 3.7 08/15/2014 1313   K 4.1 12/10/2011 1318   CL 106 10/20/2012 1449   CL 106 12/10/2011 1318   CO2 26 08/15/2014 1313   CO2 27 12/10/2011 1318   BUN 12.5 08/15/2014 1313   BUN 13 12/10/2011  1318   CREATININE 0.9 08/15/2014 1313   CREATININE 0.81 12/10/2011 1318      Component Value Date/Time   CALCIUM 9.4 08/15/2014 1313   CALCIUM 9.6 12/10/2011 1318   ALKPHOS 109 08/15/2014 1313   ALKPHOS 103 12/10/2011 1318   AST 17 08/15/2014 1313   AST 17 12/10/2011 1318   ALT 13 08/15/2014 1313   ALT 14 12/10/2011 1318   BILITOT 0.44 08/15/2014 1313   BILITOT 0.6 12/10/2011 1318      STUDIES:  CLINICAL DATA: Status post right lumpectomy for breast cancer in 2013.  EXAM: DIGITAL DIAGNOSTIC BILATERAL MAMMOGRAM WITH CAD  COMPARISON: Previous examinations.  ACR Breast Density Category b: There are scattered areas of fibroglandular density.  FINDINGS: Post lumpectomy changes on the right. No new findings suspicious for malignancy in either breast.  Mammographic images were processed  with CAD.  IMPRESSION: No evidence of malignancy.  RECOMMENDATION: Bilateral diagnostic mammogram in 1 year.  I have discussed the findings and recommendations with the patient. Results were also provided in writing at the conclusion of the visit. If applicable, a reminder letter will be sent to the patient regarding the next appointment.  BI-RADS CATEGORY 2: Benign.   Electronically Signed  By: Enrique Sack M.D.  On: 06/27/2014 11:18  ASSESSMENT: 74 y.o. [place] woman   (1)  status post right lumpectomy and sentinel lymph node sampling January of 2013 for a T1a N0, stage I A invasive ductal carcinoma, grade 3, estrogen and progesterone receptor positive, with equivocal HER-2 amplification, and an elevated MIB-1.   (2)  She completed radiation therapy April 2013 and started letrozole at that time   (a) DEXA scan 06/27/2014 showed a T score of -1.9   PLAN: Leanore assumed that vitamin D was just a supplement and so she stopped it. We went over the fact that her bones are radiated bit thin and that if she doesn't do something about it she will develop  osteoporosis at some point. She is aware that anastrozole can accelerate this process although most of the bone loss from anastrozole occurs in the first year. Today we briefly discussed bisphosphonates but she tells me she is going to start a walking program, mornings and evenings, as well as go back to taking her vitamin D.  She will see [place] again in one year and then again to years from now. At that point, which she completes 5 years of letrozole, she will "graduate.  Laelani has a good understanding of this plan. She agrees with it. She knows the goal of treatment in her case is cure. She will call with any problems that may develop before next visit here.  Chauncey Cruel, MD     09/28/2014

## 2014-09-28 NOTE — Telephone Encounter (Signed)
per pof to sch pt appt-gave pt copy of sch °

## 2014-10-03 ENCOUNTER — Other Ambulatory Visit: Payer: Self-pay | Admitting: *Deleted

## 2014-10-03 ENCOUNTER — Other Ambulatory Visit: Payer: Self-pay | Admitting: Oncology

## 2014-11-22 DIAGNOSIS — H2513 Age-related nuclear cataract, bilateral: Secondary | ICD-10-CM | POA: Diagnosis not present

## 2014-12-13 ENCOUNTER — Other Ambulatory Visit: Payer: Self-pay | Admitting: Oncology

## 2014-12-13 DIAGNOSIS — Z853 Personal history of malignant neoplasm of breast: Secondary | ICD-10-CM

## 2015-06-29 ENCOUNTER — Other Ambulatory Visit: Payer: Self-pay | Admitting: Oncology

## 2015-06-29 ENCOUNTER — Ambulatory Visit
Admission: RE | Admit: 2015-06-29 | Discharge: 2015-06-29 | Disposition: A | Discharge status: Home / Self Care | Payer: Medicare Other | Source: Ambulatory Visit | Attending: Oncology | Admitting: Oncology

## 2015-06-29 DIAGNOSIS — Z853 Personal history of malignant neoplasm of breast: Secondary | ICD-10-CM

## 2015-06-29 DIAGNOSIS — R928 Other abnormal and inconclusive findings on diagnostic imaging of breast: Secondary | ICD-10-CM | POA: Diagnosis not present

## 2015-07-05 ENCOUNTER — Other Ambulatory Visit: Payer: Self-pay | Admitting: Oncology

## 2015-09-27 ENCOUNTER — Other Ambulatory Visit: Payer: Self-pay

## 2015-09-27 DIAGNOSIS — C50911 Malignant neoplasm of unspecified site of right female breast: Secondary | ICD-10-CM

## 2015-09-28 ENCOUNTER — Other Ambulatory Visit (HOSPITAL_BASED_OUTPATIENT_CLINIC_OR_DEPARTMENT_OTHER): Payer: Medicare Other

## 2015-09-28 ENCOUNTER — Encounter: Payer: Self-pay | Admitting: Nurse Practitioner

## 2015-09-28 ENCOUNTER — Ambulatory Visit: Payer: Medicare Other | Admitting: Nurse Practitioner

## 2015-09-28 ENCOUNTER — Other Ambulatory Visit: Payer: Self-pay | Admitting: *Deleted

## 2015-09-28 ENCOUNTER — Telehealth: Payer: Self-pay | Admitting: Nurse Practitioner

## 2015-09-28 VITALS — BP 150/62 | HR 79 | Temp 98.3°F | Resp 18 | Ht 60.0 in | Wt 110.1 lb

## 2015-09-28 DIAGNOSIS — C50911 Malignant neoplasm of unspecified site of right female breast: Secondary | ICD-10-CM

## 2015-09-28 LAB — COMPREHENSIVE METABOLIC PANEL
ALT: 12 U/L (ref 0–55)
AST: 17 U/L (ref 5–34)
Albumin: 3.8 g/dL (ref 3.5–5.0)
Alkaline Phosphatase: 91 U/L (ref 40–150)
Anion Gap: 10 mEq/L (ref 3–11)
BUN: 9.2 mg/dL (ref 7.0–26.0)
CHLORIDE: 109 meq/L (ref 98–109)
CO2: 24 mEq/L (ref 22–29)
Calcium: 9.3 mg/dL (ref 8.4–10.4)
Creatinine: 0.9 mg/dL (ref 0.6–1.1)
EGFR: 61 mL/min/{1.73_m2} — ABNORMAL LOW (ref >90)
GLUCOSE: 110 mg/dL (ref 70–140)
POTASSIUM: 4 meq/L (ref 3.5–5.1)
SODIUM: 143 meq/L (ref 136–145)
Total Bilirubin: 0.77 mg/dL (ref 0.20–1.20)
Total Protein: 7.1 g/dL (ref 6.4–8.3)

## 2015-09-28 LAB — CBC WITH DIFFERENTIAL/PLATELET
BASO%: 0.4 % (ref 0.0–2.0)
BASOS ABS: 0 10*3/uL (ref 0.0–0.1)
EOS%: 1.1 % (ref 0.0–7.0)
Eosinophils Absolute: 0.1 10*3/uL (ref 0.0–0.5)
HCT: 44.8 % (ref 34.8–46.6)
HEMOGLOBIN: 14.7 g/dL (ref 11.6–15.9)
LYMPH%: 21.6 % (ref 14.0–49.7)
MCH: 29.3 pg (ref 25.1–34.0)
MCHC: 32.8 g/dL (ref 31.5–36.0)
MCV: 89.4 fL (ref 79.5–101.0)
MONO#: 0.6 10*3/uL (ref 0.1–0.9)
MONO%: 6.5 % (ref 0.0–14.0)
NEUT#: 6.4 10*3/uL (ref 1.5–6.5)
NEUT%: 70.4 % (ref 38.4–76.8)
Platelets: 420 10*3/uL — ABNORMAL HIGH (ref 145–400)
RBC: 5.01 10*6/uL (ref 3.70–5.45)
RDW: 13.4 % (ref 11.2–14.5)
WBC: 9.1 10*3/uL (ref 3.9–10.3)
lymph#: 2 10*3/uL (ref 0.9–3.3)

## 2015-09-28 MED ORDER — LETROZOLE 2.5 MG PO TABS: 2.5000 mg | ORAL_TABLET | Freq: Every day | ORAL | Status: DC

## 2015-09-28 NOTE — Telephone Encounter (Signed)
appt made and avs printed °

## 2015-09-28 NOTE — Progress Notes (Signed)
ID: Jasmine Williams   DOB: [birth year]  MR#: [id number]  RDE#:[id number]  PCP: Stephens Shire, MD GYN:  SURolm Bookbinder, MD OTHER MD:  Thea Silversmith, MD  CHIEF COMPLAINT:  Estrogen receptor positive Right Breast Cancer  CURRENT TREATMENT: Letrozole  BREAST CANCER HISTORY: From the original intake note:  The patient is a 75 year old [name] who had screening mammography at [facility] in December 2012, showing a possible abnormality in the right breast. She was referred to the breast Center, where right diagnostic mammography 07/10/2011 showed 2 clusters of microcalcifications in the right breast. These were biopsied 07/17/2011, and showed ([id number]) ductal carcinoma in situ as well as invasive ductal carcinoma. The invasive tumor was a 100% estrogen receptor positive, 11% progesterone receptor positive, with an MIB-1 of 75% and HER-2 nonamplified of with a HER-2 : CEP17 ratio of 1.22.   On January 14 the patient underwent bilateral breast MRI at [facility]. This confirmed 2 separate areas of enhancement in the lower right breast measuring a total of 5.6 cm. There were no other suspicious areas in either breast, no enlarged or suspicious lymph nodes, and no suspicious bony or upper hepatic abnormalities.  With this information the patient was referred to Dr. Donne Hazel and after appropriate discussion she underwent right lumpectomy and sentinel lymph node sampling 08/05/2011. The pathology from this procedure ([id number]) confirmed a 4 mm invasive ductal carcinoma, grade 3, with 0 of 2 sentinel lymph nodes involved. HER-2 was repeated and was equivocal, with a ratio of 1.95. Margins were clear.   Her subsequent history is as detailed below.  INTERVAL HISTORY: Morayma returns today for follow-up of her low risk breast cancer, accompanied by her husband. She has been on letrozole since April 2013 and tolerates this well with no side effects that she is aware of.  She has no problems with hot flashes, vaginal dryness, or arthralgias/myalgias. The interval history is generally unremarkable.  REVIEW OF SYSTEMS: Kortnee has no physical complaints to offer. She in good health. The only prescription medicine she is on is the letrozole. She continues on her vitamin D supplement. She has to take TUMS occasionally for indigestion. She is in no pain today. She is not regularly physically active. A detailed review of systems is otherwise entirely negative.   PAST MEDICAL HISTORY: Past Medical History  Diagnosis Date  . No pertinent past medical history   . Cancer     right breast    PAST SURGICAL HISTORY: Past Surgical History  Procedure Laterality Date  . Cholecystectomy    . Broken leg      right as a child  . Breast lumpectomy      right lump, snbx    FAMILY HISTORY Family History  Problem Relation Age of Onset  . Cancer Sister     breast  the patient's father died at the age of 1 with Alzheimer's disease. The patient's mother is currently 92 years old. The patient had no brothers. She had 2 sisters, one of whom was diagnosed with breast cancer in her mid 51s. She was not genetically tested as far as the patient is aware. There is no history of ovarian cancer or other breast cancers in the family   GYNECOLOGIC HISTORY: Menarche age 17, first pregnancy to term age 89, menopause in her early 44s. The patient is GX P3. She took Prempro for 3-5 years, stopping several years ago  SOCIAL HISTORY:  (Updated February 2015) She used to work in  the [employer] but is now retired. Her husband of 50+ years, [name], is a retired [place] policeman. The patient's sons [name] and. [name] of also were policemen. [name] is now a [employer] in [place]. [name] teaches criminal justice in [place]. The patient's daughter [name], 71, lives in [place] and is a housewife.     ADVANCED DIRECTIVES: Not in place  HEALTH MAINTENANCE:  (Updated February  2015) Social History  Substance Use Topics  . Smoking status: Former Smoker    Quit date: 07/25/1971  . Smokeless tobacco: Not on file  . Alcohol Use: Yes     Comment: rare     Colonoscopy: Never  PAP: "no longer"  Bone density: Dec 2013, osteopenia  Lipid panel:  Not on file/[facility]  No Known Allergies  Current Outpatient Prescriptions  Medication Sig Dispense Refill  . calcium carbonate (TUMS - DOSED IN MG ELEMENTAL CALCIUM) 500 MG chewable tablet Chew 2 tablets by mouth 2 (two) times daily.    . cholecalciferol (VITAMIN D) 1000 UNITS tablet Take 2,000 Units by mouth daily.     Marland Kitchen letrozole (FEMARA) 2.5 MG tablet TAKE 1 TABLET BY MOUTH DAILY. 90 tablet 0   No current facility-administered medications for this visit.    OBJECTIVE: Middle aged white female who looks well Filed Vitals:   09/28/15 1308  BP: 150/62  Pulse: 79  Temp: 98.3 F (36.8 C)  Resp: 18     Body mass index is 21.5 kg/(m^2).    ECOG FS: 0 Filed Weights   09/28/15 1308  Weight: 110 lb 1.6 oz (49.941 kg)   Skin: warm, dry  HEENT: sclerae anicteric, conjunctivae pink, oropharynx clear. No thrush or mucositis.  Lymph Nodes: No cervical or supraclavicular lymphadenopathy  Lungs: clear to auscultation bilaterally, no rales, wheezes, or rhonci  Heart: regular rate and rhythm  Abdomen: round, soft, non tender, positive bowel sounds  Musculoskeletal: No focal spinal tenderness, no peripheral edema  Neuro: non focal, well oriented, positive affect  Breasts: right breast status post lumpectomy and radiation. No evidence of recurrent disease. Right axilla benign. Left breast unremarkable.  LAB RESULTS: Lab Results  Component Value Date   WBC 9.1 09/28/2015   NEUTROABS 6.4 09/28/2015   HGB 14.7 09/28/2015   HCT 44.8 09/28/2015   MCV 89.4 09/28/2015   PLT 420* 09/28/2015      Chemistry      Component Value Date/Time   NA 143 09/28/2015 1229   NA 144 12/10/2011 1318   K 4.0 09/28/2015 1229   K 4.1  12/10/2011 1318   CL 106 10/20/2012 1449   CL 106 12/10/2011 1318   CO2 24 09/28/2015 1229   CO2 27 12/10/2011 1318   BUN 9.2 09/28/2015 1229   BUN 13 12/10/2011 1318   CREATININE 0.9 09/28/2015 1229   CREATININE 0.81 12/10/2011 1318      Component Value Date/Time   CALCIUM 9.3 09/28/2015 1229   CALCIUM 9.6 12/10/2011 1318   ALKPHOS 91 09/28/2015 1229   ALKPHOS 103 12/10/2011 1318   AST 17 09/28/2015 1229   AST 17 12/10/2011 1318   ALT 12 09/28/2015 1229   ALT 14 12/10/2011 1318   BILITOT 0.77 09/28/2015 1229   BILITOT 0.6 12/10/2011 1318      STUDIES: EXAM: DIGITAL DIAGNOSTIC BILATERAL MAMMOGRAM WITH 3D TOMOSYNTHESIS AND CAD  COMPARISON: Previous exam(s).  ACR Breast Density Category b: There are scattered areas of fibroglandular density.  FINDINGS: Cc and MLO views of bilateral breasts, spot  tangential view of right breast are submitted. Postsurgical changes are identified in the right breast stable. No suspicious abnormalities identified in both breasts.  Mammographic images were processed with CAD.  IMPRESSION: Benign findings.  RECOMMENDATION: Bilateral diagnostic mammogram in 1 year.  I have discussed the findings and recommendations with the patient. Results were also provided in writing at the conclusion of the visit. If applicable, a reminder letter will be sent to the patient regarding the next appointment.  BI-RADS CATEGORY 2: Benign.   Electronically Signed  By: Abelardo Diesel M.D.  On: 06/29/2015 11:39  ASSESSMENT: 75 y.o. [place] woman   (1)  status post right lumpectomy and sentinel lymph node sampling January of 2013 for a T1a N0, stage I A invasive ductal carcinoma, grade 3, estrogen and progesterone receptor positive, with equivocal HER-2 amplification, and an elevated MIB-1.   (2)  She completed radiation therapy April 2013 and started letrozole at that time   (a) DEXA scan 06/27/2014 showed a T score of  -1.9   PLAN: Liese is doing well as far as her breast cancer is concerned. She is now 4 years out from her definitive surgery with no evidence of recurrent disease. Her most recent mammogram was negative. The labs were reviewed in detail and were entirely stable. She is tolerating the letrozole well and will continue this drug at least 1 more year to complete 5 years of antiestrogen therapy.   Stanley will be due for a repeat mammogram in December. She will return in 1 year for follow up with Dr. Jana Hakim. At that time he will "graduate" her from observation visits and discontinue antiestrogen therapy. She understands and agrees with this plan. She knows the goal of treatment in her case is cure. She has been encouraged to call with any issues that might arise before her next visit here.   Laurie Panda, NP     09/28/2015

## 2015-11-14 DIAGNOSIS — H35342 Macular cyst, hole, or pseudohole, left eye: Secondary | ICD-10-CM | POA: Diagnosis not present

## 2015-12-27 DIAGNOSIS — H35342 Macular cyst, hole, or pseudohole, left eye: Secondary | ICD-10-CM | POA: Diagnosis not present

## 2015-12-27 DIAGNOSIS — H43811 Vitreous degeneration, right eye: Secondary | ICD-10-CM | POA: Diagnosis not present

## 2016-01-04 DIAGNOSIS — H2513 Age-related nuclear cataract, bilateral: Secondary | ICD-10-CM | POA: Diagnosis not present

## 2016-01-04 DIAGNOSIS — H353213 Exudative age-related macular degeneration, right eye, with inactive scar: Secondary | ICD-10-CM | POA: Diagnosis not present

## 2016-01-04 DIAGNOSIS — H524 Presbyopia: Secondary | ICD-10-CM | POA: Diagnosis not present

## 2016-01-29 DIAGNOSIS — H2512 Age-related nuclear cataract, left eye: Secondary | ICD-10-CM | POA: Diagnosis not present

## 2016-02-07 DIAGNOSIS — H2512 Age-related nuclear cataract, left eye: Secondary | ICD-10-CM | POA: Diagnosis not present

## 2016-02-07 DIAGNOSIS — H25812 Combined forms of age-related cataract, left eye: Secondary | ICD-10-CM | POA: Diagnosis not present

## 2016-03-15 DIAGNOSIS — H43811 Vitreous degeneration, right eye: Secondary | ICD-10-CM | POA: Diagnosis not present

## 2016-03-15 DIAGNOSIS — H35342 Macular cyst, hole, or pseudohole, left eye: Secondary | ICD-10-CM | POA: Diagnosis not present

## 2016-03-28 DIAGNOSIS — H35342 Macular cyst, hole, or pseudohole, left eye: Secondary | ICD-10-CM | POA: Diagnosis not present

## 2016-04-03 DIAGNOSIS — H35342 Macular cyst, hole, or pseudohole, left eye: Secondary | ICD-10-CM | POA: Diagnosis not present

## 2016-04-24 DIAGNOSIS — H35342 Macular cyst, hole, or pseudohole, left eye: Secondary | ICD-10-CM | POA: Diagnosis not present

## 2016-05-27 ENCOUNTER — Other Ambulatory Visit: Payer: Self-pay | Admitting: Oncology

## 2016-05-27 DIAGNOSIS — Z853 Personal history of malignant neoplasm of breast: Secondary | ICD-10-CM

## 2016-07-04 ENCOUNTER — Ambulatory Visit
Admission: RE | Admit: 2016-07-04 | Discharge: 2016-07-04 | Disposition: A | Discharge status: Home / Self Care | Payer: Medicare Other | Source: Ambulatory Visit | Attending: Oncology | Admitting: Oncology

## 2016-07-04 DIAGNOSIS — Z853 Personal history of malignant neoplasm of breast: Secondary | ICD-10-CM

## 2016-07-04 DIAGNOSIS — R928 Other abnormal and inconclusive findings on diagnostic imaging of breast: Secondary | ICD-10-CM | POA: Diagnosis not present

## 2016-09-25 ENCOUNTER — Other Ambulatory Visit: Payer: Self-pay | Admitting: Adult Health

## 2016-09-25 DIAGNOSIS — C50911 Malignant neoplasm of unspecified site of right female breast: Secondary | ICD-10-CM

## 2016-09-25 DIAGNOSIS — Z170 Estrogen receptor positive status [ER+]: Principal | ICD-10-CM

## 2016-09-26 ENCOUNTER — Other Ambulatory Visit (HOSPITAL_BASED_OUTPATIENT_CLINIC_OR_DEPARTMENT_OTHER): Payer: Medicare Other

## 2016-09-26 ENCOUNTER — Ambulatory Visit (HOSPITAL_BASED_OUTPATIENT_CLINIC_OR_DEPARTMENT_OTHER): Payer: Medicare Other | Admitting: Oncology

## 2016-09-26 VITALS — BP 158/78 | HR 107 | Temp 98.0°F | Resp 18 | Ht 60.0 in | Wt 108.3 lb

## 2016-09-26 DIAGNOSIS — C50911 Malignant neoplasm of unspecified site of right female breast: Secondary | ICD-10-CM | POA: Diagnosis not present

## 2016-09-26 DIAGNOSIS — Z170 Estrogen receptor positive status [ER+]: Secondary | ICD-10-CM | POA: Diagnosis not present

## 2016-09-26 DIAGNOSIS — M8580 Other specified disorders of bone density and structure, unspecified site: Secondary | ICD-10-CM

## 2016-09-26 LAB — COMPREHENSIVE METABOLIC PANEL
ALBUMIN: 3.7 g/dL (ref 3.5–5.0)
ALT: 13 U/L (ref 0–55)
ANION GAP: 11 meq/L (ref 3–11)
AST: 17 U/L (ref 5–34)
Alkaline Phosphatase: 123 U/L (ref 40–150)
BUN: 9.8 mg/dL (ref 7.0–26.0)
CO2: 25 mEq/L (ref 22–29)
Calcium: 9.9 mg/dL (ref 8.4–10.4)
Chloride: 106 mEq/L (ref 98–109)
Creatinine: 1 mg/dL (ref 0.6–1.1)
EGFR: 57 mL/min/{1.73_m2} — AB (ref >90)
Glucose: 91 mg/dl (ref 70–140)
Potassium: 3.9 mEq/L (ref 3.5–5.1)
Sodium: 142 mEq/L (ref 136–145)
TOTAL PROTEIN: 7.3 g/dL (ref 6.4–8.3)
Total Bilirubin: 0.77 mg/dL (ref 0.20–1.20)

## 2016-09-26 LAB — CBC WITH DIFFERENTIAL/PLATELET
BASO%: 0.8 % (ref 0.0–2.0)
BASOS ABS: 0.1 10*3/uL (ref 0.0–0.1)
EOS ABS: 0 10*3/uL (ref 0.0–0.5)
EOS%: 0.4 % (ref 0.0–7.0)
HCT: 43.9 % (ref 34.8–46.6)
HEMOGLOBIN: 14.6 g/dL (ref 11.6–15.9)
LYMPH%: 15.5 % (ref 14.0–49.7)
MCH: 28.5 pg (ref 25.1–34.0)
MCHC: 33.3 g/dL (ref 31.5–36.0)
MCV: 85.5 fL (ref 79.5–101.0)
MONO#: 0.6 10*3/uL (ref 0.1–0.9)
MONO%: 6.1 % (ref 0.0–14.0)
NEUT#: 7.9 10*3/uL — ABNORMAL HIGH (ref 1.5–6.5)
NEUT%: 77.2 % — ABNORMAL HIGH (ref 38.4–76.8)
PLATELETS: 453 10*3/uL — AB (ref 145–400)
RBC: 5.13 10*6/uL (ref 3.70–5.45)
RDW: 13.8 % (ref 11.2–14.5)
WBC: 10.3 10*3/uL (ref 3.9–10.3)
lymph#: 1.6 10*3/uL (ref 0.9–3.3)

## 2016-09-26 NOTE — Progress Notes (Signed)
ID: Jasmine Williams   DOB: Sep 27, 1940  MR#: [id number]  LPF#:[id number]  PCP: Stephens Shire, MD GYN:  SURolm Bookbinder, MD OTHER MD:  Thea Silversmith, MD  CHIEF COMPLAINT:  Estrogen receptor positive Right Breast Cancer  CURRENT TREATMENT: Completing 5 years of Letrozole  BREAST CANCER HISTORY: From the original intake note:  The patient is a 76 year old [name] who had screening mammography at [facility] in December 2012, showing a possible abnormality in the right breast. She was referred to the breast Center, where right diagnostic mammography 07/10/2011 showed 2 clusters of microcalcifications in the right breast. These were biopsied 07/17/2011, and showed ([id number]) ductal carcinoma in situ as well as invasive ductal carcinoma. The invasive tumor was a 100% estrogen receptor positive, 11% progesterone receptor positive, with an MIB-1 of 75% and HER-2 nonamplified of with a HER-2 : CEP17 ratio of 1.22.   On January 14 the patient underwent bilateral breast MRI at [facility]. This confirmed 2 separate areas of enhancement in the lower right breast measuring a total of 5.6 cm. There were no other suspicious areas in either breast, no enlarged or suspicious lymph nodes, and no suspicious bony or upper hepatic abnormalities.  With this information the patient was referred to Dr. Donne Hazel and after appropriate discussion she underwent right lumpectomy and sentinel lymph node sampling 08/05/2011. The pathology from this procedure ([id number]) confirmed a 4 mm invasive ductal carcinoma, grade 3, with 0 of 2 sentinel lymph nodes involved. HER-2 was repeated and was equivocal, with a ratio of 1.95. Margins were clear.   Her subsequent history is as detailed below.  INTERVAL HISTORY: Hertha returns today for follow-up of her estrogen receptor positive breast cancer. Interval history is unremarkable. She continues on letrozole, with good tolerance. Hot flashes and  vaginal dryness are not a major issue. She never developed the arthralgias or myalgias that many patients can experience on this medication. She obtains it at a good price.   REVIEW OF SYSTEMS: Jakeisha  does not exercise regularly though she does do her housework and walks to the mailbox every day. She enjoys her grandchildren in town. A detailed review of systems today was otherwise noncontributory   PAST MEDICAL HISTORY: Past Medical History:  Diagnosis Date  . Cancer ([place])    right breast  . No pertinent past medical history     PAST SURGICAL HISTORY: Past Surgical History:  Procedure Laterality Date  . BREAST LUMPECTOMY     right lump, snbx  . broken leg     right as a child  . CHOLECYSTECTOMY      FAMILY HISTORY Family History  Problem Relation Age of Onset  . Cancer Sister     breast  the patient's father died at the age of 50 with Alzheimer's disease. The patient's mother is currently 82 years old. The patient had no brothers. She had 2 sisters, one of whom was diagnosed with breast cancer in her mid 36s. She was not genetically tested as far as the patient is aware. There is no history of ovarian cancer or other breast cancers in the family   GYNECOLOGIC HISTORY: Menarche age 29, first pregnancy to term age 35, menopause in her early 68s. The patient is GX P3. She took Prempro for 3-5 years, stopping several years ago  SOCIAL HISTORY:  (Updated February 2015) She used to work in the [employer] but is now retired. Her husband of 50+ years, [name], is a retired [place] policeman.  The patient's sons [name] and. [name] of also were policemen. [name] is now a [employer] in [place]. [name] teaches criminal justice in [place]. The patient's daughter [name], 15, lives in [place] and is a housewife.     ADVANCED DIRECTIVES: Not in place  HEALTH MAINTENANCE:  (Updated February 2015) Social History  Substance Use Topics  . Smoking status: Former Smoker     Quit date: 07/25/1971  . Smokeless tobacco: Not on file  . Alcohol use Yes     Comment: rare     Colonoscopy: Never  PAP: "no longer"  Bone density: Dec 2013, osteopenia  Lipid panel:  Not on file/[facility]  No Known Allergies  Current Outpatient Prescriptions  Medication Sig Dispense Refill  . calcium carbonate (TUMS - DOSED IN MG ELEMENTAL CALCIUM) 500 MG chewable tablet Chew 2 tablets by mouth 2 (two) times daily.    . cholecalciferol (VITAMIN D) 1000 UNITS tablet Take 2,000 Units by mouth daily.     Marland Kitchen letrozole (FEMARA) 2.5 MG tablet Take 1 tablet (2.5 mg total) by mouth daily. 90 tablet 4   No current facility-administered medications for this visit.     OBJECTIVE: Middle aged white female In no acute distress  Vitals:   09/26/16 1244  BP: (!) 158/78  Pulse: (!) 107  Resp: 18  Temp: 98 F (36.7 C)     Body mass index is 21.15 kg/m.    ECOG FS: 0 Filed Weights   09/26/16 1244  Weight: 108 lb 4.8 oz (49.1 kg)   Sclerae unicteric, EOMs intact Oropharynx clear and moist No cervical or supraclavicular adenopathy Lungs no rales or rhonchi Heart regular rate and rhythm Abd soft, nontender, positive bowel sounds MSK no focal spinal tenderness, no upper extremity lymphedema Neuro: nonfocal, well oriented, appropriate affect Breasts: The right breast has undergone lumpectomy and radiation with no evidence of local recurrence. Left breast is unremarkable. Both axillae are benign.  LAB RESULTS: Lab Results  Component Value Date   WBC 10.3 09/26/2016   NEUTROABS 7.9 (H) 09/26/2016   HGB 14.6 09/26/2016   HCT 43.9 09/26/2016   MCV 85.5 09/26/2016   PLT 453 (H) 09/26/2016      Chemistry      Component Value Date/Time   NA 143 09/28/2015 1229   K 4.0 09/28/2015 1229   CL 106 10/20/2012 1449   CO2 24 09/28/2015 1229   BUN 9.2 09/28/2015 1229   CREATININE 0.9 09/28/2015 1229      Component Value Date/Time   CALCIUM 9.3 09/28/2015 1229   ALKPHOS 91 09/28/2015 1229    AST 17 09/28/2015 1229   ALT 12 09/28/2015 1229   BILITOT 0.77 09/28/2015 1229      STUDIES: Mammography at the [facility] 07/04/2016 showed a breast density category of B, meaning the breasts are not dense. There was no evidence of malignancy.   ASSESSMENT: 76 y.o. [place] woman   (1)  status post right lumpectomy and sentinel lymph node sampling January of 2013 for a T1a N0, stage I A invasive ductal carcinoma, grade 3, estrogen and progesterone receptor positive, with equivocal HER-2 amplification, and an elevated MIB-1.   (2)  She completed radiation therapy April 2013 and started letrozole at that time, completed 5 years march 2018   (a) DEXA scan 06/27/2014 showed a T score of -1.9   PLAN: Amai is now a little over 5 years out from definitive surgery for her breast cancer with no evidence of disease recurrence.  This is very favorable.  She completes 5 years of letrozole this month. She has tolerated that well. In some patients who are at high risk of recurrence, continuing for an additional 2 years is recommended. In her case her prognosis is so good that additional letrozole really would be otiose.  Accordingly she is going off that medication as she completes her current supply.  At this point I feel comfortable releasing her back to her primary care physician. All she will need in terms of breast cancer follow-up is yearly mammography, which she usually obtains late December or early January at the [facility], and a yearly physician breast exam.  I will be glad to see [place] at any point in the future if on when the need arises, but as of now are making her further routine appointments for her  Chauncey Cruel, MD     09/26/2016

## 2016-11-26 DIAGNOSIS — H35342 Macular cyst, hole, or pseudohole, left eye: Secondary | ICD-10-CM | POA: Diagnosis not present

## 2017-04-29 ENCOUNTER — Emergency Department (HOSPITAL_COMMUNITY)
Admission: EM | Admit: 2017-04-29 | Discharge: 2017-04-29 | Disposition: A | Discharge status: Home / Self Care | Payer: Medicare Other | Attending: Emergency Medicine | Admitting: Emergency Medicine

## 2017-04-29 ENCOUNTER — Emergency Department (HOSPITAL_COMMUNITY): Payer: Medicare Other

## 2017-04-29 ENCOUNTER — Encounter (HOSPITAL_COMMUNITY): Payer: Self-pay | Admitting: Emergency Medicine

## 2017-04-29 DIAGNOSIS — S3282XA Multiple fractures of pelvis without disruption of pelvic ring, initial encounter for closed fracture: Secondary | ICD-10-CM | POA: Insufficient documentation

## 2017-04-29 DIAGNOSIS — Y929 Unspecified place or not applicable: Secondary | ICD-10-CM | POA: Insufficient documentation

## 2017-04-29 DIAGNOSIS — Y999 Unspecified external cause status: Secondary | ICD-10-CM | POA: Insufficient documentation

## 2017-04-29 DIAGNOSIS — Y939 Activity, unspecified: Secondary | ICD-10-CM | POA: Insufficient documentation

## 2017-04-29 DIAGNOSIS — Z79899 Other long term (current) drug therapy: Secondary | ICD-10-CM | POA: Diagnosis not present

## 2017-04-29 DIAGNOSIS — X58XXXA Exposure to other specified factors, initial encounter: Secondary | ICD-10-CM | POA: Diagnosis not present

## 2017-04-29 DIAGNOSIS — S8991XA Unspecified injury of right lower leg, initial encounter: Secondary | ICD-10-CM | POA: Diagnosis present

## 2017-04-29 DIAGNOSIS — S32591A Other specified fracture of right pubis, initial encounter for closed fracture: Secondary | ICD-10-CM | POA: Diagnosis not present

## 2017-04-29 DIAGNOSIS — M25551 Pain in right hip: Secondary | ICD-10-CM

## 2017-04-29 DIAGNOSIS — Z853 Personal history of malignant neoplasm of breast: Secondary | ICD-10-CM | POA: Insufficient documentation

## 2017-04-29 DIAGNOSIS — Z87891 Personal history of nicotine dependence: Secondary | ICD-10-CM | POA: Diagnosis not present

## 2017-04-29 DIAGNOSIS — M79651 Pain in right thigh: Secondary | ICD-10-CM | POA: Diagnosis not present

## 2017-04-29 DIAGNOSIS — Z9049 Acquired absence of other specified parts of digestive tract: Secondary | ICD-10-CM | POA: Diagnosis not present

## 2017-04-29 DIAGNOSIS — M8588 Other specified disorders of bone density and structure, other site: Secondary | ICD-10-CM | POA: Diagnosis not present

## 2017-04-29 DIAGNOSIS — S32511A Fracture of superior rim of right pubis, initial encounter for closed fracture: Secondary | ICD-10-CM | POA: Diagnosis not present

## 2017-04-29 MED ORDER — OXYCODONE-ACETAMINOPHEN 5-325 MG PO TABS: 1.0000 | ORAL_TABLET | ORAL | 0 refills | Status: DC | PRN

## 2017-04-29 MED ORDER — OXYCODONE-ACETAMINOPHEN 5-325 MG PO TABS
1.0000 | ORAL_TABLET | Freq: Once | ORAL | Status: AC
  Administered 2017-04-29: 1 via ORAL
  Filled 2017-04-29: qty 1

## 2017-04-29 NOTE — ED Provider Notes (Signed)
[facility] DEPT Provider Note   CSN: [id number] Arrival date & time: 04/29/17  1357     History   Chief Complaint Chief Complaint  Patient presents with  . pubic rami fracture snet by Sadie Haber    HPI Jasmine Williams is a 76 y.o. female.  The history is provided by the patient, a relative and medical records.  Hip Pain  This is a new problem. The current episode started more than 1 week ago. The problem occurs constantly. The problem has not changed since onset.Pertinent negatives include no chest pain, no abdominal pain, no headaches and no shortness of breath. The symptoms are aggravated by standing and walking. Nothing relieves the symptoms. She has tried nothing for the symptoms. The treatment provided no relief.    Past Medical History:  Diagnosis Date  . Cancer [facility])    right breast  . No pertinent past medical history     Patient Active Problem List   Diagnosis Date Noted  . History of breast cancer 08/23/2013  . Postmenopausal estrogen deficiency 08/23/2013  . Osteopenia 08/23/2013  . Unspecified vitamin D deficiency 11/06/2012  . Breast cancer, right breast ([place]) 08/21/2011    Past Surgical History:  Procedure Laterality Date  . BREAST LUMPECTOMY     right lump, snbx  . broken leg     right as a child  . CHOLECYSTECTOMY      OB History    No data available       Home Medications    Prior to Admission medications   Medication Sig Start Date End Date Taking? Authorizing Provider  calcium carbonate (TUMS - DOSED IN MG ELEMENTAL CALCIUM) 500 MG chewable tablet Chew 2 tablets by mouth 2 (two) times daily.    [provider]  cholecalciferol (VITAMIN D) 1000 UNITS tablet Take 2,000 Units by mouth daily.     [provider]  letrozole (FEMARA) 2.5 MG tablet Take 1 tablet (2.5 mg total) by mouth daily. 09/28/15   Boelter, Genelle Gather, NP    Family History Family History  Problem Relation Age of Onset  .  Cancer Sister        breast    Social History Social History  Substance Use Topics  . Smoking status: Former Smoker    Quit date: 07/25/1971  . Smokeless tobacco: Not on file  . Alcohol use Yes     Comment: rare     Allergies   Patient has no known allergies.   Review of Systems Review of Systems  Constitutional: Negative for chills, diaphoresis, fatigue and fever.  HENT: Negative for congestion and rhinorrhea.   Respiratory: Negative for cough, chest tightness, shortness of breath and wheezing.   Cardiovascular: Negative for chest pain and palpitations.  Gastrointestinal: Negative for abdominal pain.  Genitourinary: Negative for dysuria and flank pain.  Musculoskeletal: Negative for back pain, neck pain and neck stiffness.  Skin: Negative for rash and wound.  Neurological: Negative for weakness, light-headedness, numbness and headaches.  Psychiatric/Behavioral: Negative for agitation and confusion.  All other systems reviewed and are negative.    Physical Exam Updated Vital Signs BP (!) 157/67 (BP Location: Left Arm)   Pulse 82   Temp 98.3 F (36.8 C) (Oral)   Resp 19   Ht 5' (1.524 m)   SpO2 99%   Physical Exam  Constitutional: She appears well-developed and well-nourished. No distress.  HENT:  Head: Normocephalic and atraumatic.  Mouth/Throat: Oropharynx is clear and moist. No  oropharyngeal exudate.  Eyes: Pupils are equal, round, and reactive to light. Conjunctivae and EOM are normal.  Neck: Normal range of motion. Neck supple.  Cardiovascular: Normal rate.   No murmur heard. Pulmonary/Chest: Effort normal and breath sounds normal. No stridor. No respiratory distress. She has no wheezes. She exhibits no tenderness.  Abdominal: Soft. There is no tenderness.  Musculoskeletal: She exhibits tenderness. She exhibits no edema.       Right hip: She exhibits tenderness. She exhibits no crepitus and no deformity.       Legs: Neurological: She is alert. She is not  disoriented. No cranial nerve deficit or sensory deficit. She exhibits normal muscle tone. GCS eye subscore is 4. GCS verbal subscore is 5. GCS motor subscore is 6.  Normal sensation, strength, and pulses in bilateral lower extremities.  Skin: Skin is warm and dry. Capillary refill takes less than 2 seconds. No rash noted. She is not diaphoretic. No erythema.  Psychiatric: She has a normal mood and affect.  Nursing note and vitals reviewed.    ED Treatments / Results  Labs (all labs ordered are listed, but only abnormal results are displayed) Labs Reviewed - No data to display  EKG  EKG Interpretation None       Radiology Dg Hip Unilat W Or Wo Pelvis 2-3 Views Right  Result Date: 04/29/2017 CLINICAL DATA:  76 year old female with right leg pain x2 weeks. EXAM: DG HIP (WITH OR WITHOUT PELVIS) 2-3V RIGHT COMPARISON:  None. FINDINGS: There are minimally displaced fractures of the right superior and inferior pubic rami. There is apparent sclerotic changes of the edges of the inferior pubic ramus fracture which may indicate a somewhat subacute timeframe. Acute fracture is not excluded. Correlation with clinical exam recommended. No other fracture identified. The bones are osteopenic. There are degenerative changes of the lower lumbar spine. The soft tissues appear unremarkable. IMPRESSION: Minimally displaced fractures of the right superior and inferior pubic rami, acute or possibly subacute. Clinical correlation is recommended. Electronically Signed   By: Anner Crete M.D.   On: 04/29/2017 18:32    Procedures Procedures (including critical care time)  Medications Ordered in ED Medications  oxyCODONE-acetaminophen (PERCOCET/ROXICET) 5-325 MG per tablet 1 tablet (1 tablet Oral Given 04/29/17 1934)     Initial Impression / Assessment and Plan / ED Course  I have reviewed the triage vital signs and the nursing notes.  Pertinent labs & imaging results that were available during my  care of the patient were reviewed by me and considered in my medical decision making (see chart for details).     Jasmine Williams is a 76 y.o. female with a past medical history significant for breast cancer who presents from her primary care physician for further evaluation after discovery of pelvic fractures.  Patient does have for the last 2 weeks she has had pain in her right pelvis when she tries to ambulate or stand.  She denies any traumatic injuries or known onset of symptoms.  She has no history of fractures or pelvic fractures.  She denies any pain in any other locations of her body.  She went to her PCP think he was going to be a muscle cramp but x-ray revealed inferior and superior pubic rami fractures.  Patient was sent to the ED for evaluation and orthopedic consultation.  On my exam, patient had minimal tenderness in the right pelvis.  Patient had some pain with compression on the pelvis but pelvis was stable.  Patient  was able to move her right leg in all directions with minimal discomfort.  Patient normal sensation and strength in bilateral legs.  Patient was able to ambulate with a cane.   Repeat x-ray was obtained as the images were on reviewed.  Minimally displaced fracture of the right superior and inferior pubic rami were seen.  Dr. Percell Miller with orthopedics was called who recommended weightbearing as tolerated and follow-up with him in clinic.    Patient given 1 dose of Percocet and reassess.  Patient was able to ambulate with her cane.  Patient will be discharged home with plans to follow-up with Dr. Percell Miller with orthopedics as well as return precautions.  Patient voiced understanding of plan of care and will be discharged.    Final Clinical Impressions(s) / ED Diagnoses   Final diagnoses:  Closed fracture of multiple pubic rami, right, initial encounter ([place])  Right hip pain    New Prescriptions New Prescriptions   No medications on file    Clinical  Impression: 1. Closed fracture of multiple pubic rami, right, initial encounter ([place])   2. Right hip pain     Disposition: Discharge  Condition: Good  I have discussed the results, Dx and Tx plan with the pt(& family if present). He/she/they expressed understanding and agree(s) with the plan. Discharge instructions discussed at great length. Strict return precautions discussed and pt &/or family have verbalized understanding of the instructions. No further questions at time of discharge.    Discharge Medication List as of 04/29/2017  8:24 PM    START taking these medications   Details  oxyCODONE-acetaminophen (PERCOCET/ROXICET) 5-325 MG tablet Take 1 tablet by mouth every 4 (four) hours as needed for severe pain., Starting Tue 04/29/2017, Print        Follow Up: Renette Butters, MD [address] [id number] [phone]  Schedule an appointment as soon as possible for a visit    [facility] DEPT [place] [id number] [place] [phone]  If symptoms worsen     Tegeler, Gwenyth Allegra, MD 04/30/17 [phone]

## 2017-04-29 NOTE — ED Triage Notes (Signed)
Patient having right leg pain for about 2 weeks. Had xray done at [facility] that showed inferior pubic rami fracture and was told to come to ED.  Patient having pain with weight bearing and having to use cane.

## 2017-04-29 NOTE — Discharge Instructions (Signed)
Your imaging confirmed right-sided pelvic fractures.  After speaking with orthopedics, we felt you were safe for discharge home and outpatient follow-up.  Please call to schedule an appointment with Dr. Percell Miller with orthopedics.  He recommended weightbearing as tolerated and using the pain medicine to help control your discomfort.  If any symptoms change or worsen, please return to the nearest emergency department.  Please be careful not to fall or have any traumatic injuries that could worsening your fractures.

## 2017-04-29 NOTE — ED Notes (Signed)
Pt walked to bathroom and back without any assistance

## 2017-05-05 DIAGNOSIS — E559 Vitamin D deficiency, unspecified: Secondary | ICD-10-CM | POA: Diagnosis not present

## 2017-05-05 DIAGNOSIS — M810 Age-related osteoporosis without current pathological fracture: Secondary | ICD-10-CM | POA: Diagnosis not present

## 2017-05-05 DIAGNOSIS — R5383 Other fatigue: Secondary | ICD-10-CM | POA: Diagnosis not present

## 2017-05-05 DIAGNOSIS — M25551 Pain in right hip: Secondary | ICD-10-CM | POA: Diagnosis not present

## 2017-05-07 DIAGNOSIS — M810 Age-related osteoporosis without current pathological fracture: Secondary | ICD-10-CM | POA: Diagnosis not present

## 2017-05-07 DIAGNOSIS — M8589 Other specified disorders of bone density and structure, multiple sites: Secondary | ICD-10-CM | POA: Diagnosis not present

## 2017-05-22 DIAGNOSIS — E559 Vitamin D deficiency, unspecified: Secondary | ICD-10-CM | POA: Diagnosis not present

## 2017-05-22 DIAGNOSIS — M810 Age-related osteoporosis without current pathological fracture: Secondary | ICD-10-CM | POA: Diagnosis not present

## 2017-05-23 ENCOUNTER — Other Ambulatory Visit: Payer: Self-pay

## 2017-05-26 DIAGNOSIS — M25551 Pain in right hip: Secondary | ICD-10-CM | POA: Diagnosis not present

## 2017-06-23 DIAGNOSIS — M25551 Pain in right hip: Secondary | ICD-10-CM | POA: Diagnosis not present

## 2017-09-01 DIAGNOSIS — E559 Vitamin D deficiency, unspecified: Secondary | ICD-10-CM | POA: Diagnosis not present

## 2017-09-01 DIAGNOSIS — M810 Age-related osteoporosis without current pathological fracture: Secondary | ICD-10-CM | POA: Diagnosis not present

## 2017-12-26 DIAGNOSIS — H35359 Cystoid macular degeneration, unspecified eye: Secondary | ICD-10-CM | POA: Diagnosis not present

## 2018-01-12 DIAGNOSIS — M542 Cervicalgia: Secondary | ICD-10-CM | POA: Diagnosis not present

## 2018-01-12 DIAGNOSIS — M818 Other osteoporosis without current pathological fracture: Secondary | ICD-10-CM | POA: Diagnosis not present

## 2018-01-12 DIAGNOSIS — M5030 Other cervical disc degeneration, unspecified cervical region: Secondary | ICD-10-CM | POA: Diagnosis not present

## 2018-01-12 DIAGNOSIS — M549 Dorsalgia, unspecified: Secondary | ICD-10-CM | POA: Diagnosis not present

## 2018-05-31 DIAGNOSIS — H9190 Unspecified hearing loss, unspecified ear: Secondary | ICD-10-CM | POA: Diagnosis not present

## 2018-06-10 DIAGNOSIS — H9190 Unspecified hearing loss, unspecified ear: Secondary | ICD-10-CM | POA: Diagnosis not present

## 2018-06-10 DIAGNOSIS — H903 Sensorineural hearing loss, bilateral: Secondary | ICD-10-CM | POA: Diagnosis not present

## 2018-06-16 DIAGNOSIS — H903 Sensorineural hearing loss, bilateral: Secondary | ICD-10-CM | POA: Diagnosis not present

## 2018-07-02 DIAGNOSIS — H903 Sensorineural hearing loss, bilateral: Secondary | ICD-10-CM | POA: Diagnosis not present

## 2018-08-28 ENCOUNTER — Ambulatory Visit (INDEPENDENT_AMBULATORY_CARE_PROVIDER_SITE_OTHER): Payer: Medicare Other | Admitting: Family Medicine

## 2018-08-28 ENCOUNTER — Encounter: Payer: Self-pay | Admitting: Family Medicine

## 2018-08-28 VITALS — BP 164/100 | HR 93 | Temp 98.0°F | Ht 60.0 in | Wt 105.8 lb

## 2018-08-28 DIAGNOSIS — R413 Other amnesia: Secondary | ICD-10-CM | POA: Diagnosis not present

## 2018-08-28 DIAGNOSIS — Z13220 Encounter for screening for lipoid disorders: Secondary | ICD-10-CM | POA: Diagnosis not present

## 2018-08-28 DIAGNOSIS — E559 Vitamin D deficiency, unspecified: Secondary | ICD-10-CM

## 2018-08-28 DIAGNOSIS — R030 Elevated blood-pressure reading, without diagnosis of hypertension: Secondary | ICD-10-CM | POA: Diagnosis not present

## 2018-08-28 LAB — CBC WITH DIFFERENTIAL/PLATELET
BASOS ABS: 0 10*3/uL (ref 0.0–0.1)
Basophils Relative: 0.3 % (ref 0.0–3.0)
EOS PCT: 0.4 % (ref 0.0–5.0)
Eosinophils Absolute: 0 10*3/uL (ref 0.0–0.7)
HCT: 47.1 % — ABNORMAL HIGH (ref 36.0–46.0)
HEMOGLOBIN: 15.6 g/dL — AB (ref 12.0–15.0)
LYMPHS PCT: 19.3 % (ref 12.0–46.0)
Lymphs Abs: 1.8 10*3/uL (ref 0.7–4.0)
MCHC: 33.1 g/dL (ref 30.0–36.0)
MCV: 86.3 fl (ref 78.0–100.0)
MONOS PCT: 9 % (ref 3.0–12.0)
Monocytes Absolute: 0.8 10*3/uL (ref 0.1–1.0)
Neutro Abs: 6.7 10*3/uL (ref 1.4–7.7)
Neutrophils Relative %: 71 % (ref 43.0–77.0)
Platelets: 461 10*3/uL — ABNORMAL HIGH (ref 150.0–400.0)
RBC: 5.46 Mil/uL — AB (ref 3.87–5.11)
RDW: 13.9 % (ref 11.5–15.5)
WBC: 9.4 10*3/uL (ref 4.0–10.5)

## 2018-08-28 LAB — COMPREHENSIVE METABOLIC PANEL
ALBUMIN: 4.2 g/dL (ref 3.5–5.2)
ALK PHOS: 96 U/L (ref 39–117)
ALT: 35 U/L (ref 0–35)
AST: 32 U/L (ref 0–37)
BILIRUBIN TOTAL: 0.7 mg/dL (ref 0.2–1.2)
BUN: 24 mg/dL — AB (ref 6–23)
CO2: 28 mEq/L (ref 19–32)
Calcium: 9.6 mg/dL (ref 8.4–10.5)
Chloride: 104 mEq/L (ref 96–112)
Creatinine, Ser: 0.92 mg/dL (ref 0.40–1.20)
GFR: 59.25 mL/min — AB (ref >60.00)
Glucose, Bld: 96 mg/dL (ref 70–99)
POTASSIUM: 4.6 meq/L (ref 3.5–5.1)
Sodium: 143 mEq/L (ref 135–145)
TOTAL PROTEIN: 6.8 g/dL (ref 6.0–8.3)

## 2018-08-28 LAB — URINALYSIS, ROUTINE W REFLEX MICROSCOPIC
Bilirubin Urine: NEGATIVE
KETONES UR: NEGATIVE
Nitrite: NEGATIVE
SPECIFIC GRAVITY, URINE: 1.02 (ref 1.000–1.030)
TOTAL PROTEIN, URINE-UPE24: 30 — AB
URINE GLUCOSE: NEGATIVE
UROBILINOGEN UA: 1 (ref 0.0–1.0)
pH: 6.5 (ref 5.0–8.0)

## 2018-08-28 LAB — LIPID PANEL
CHOL/HDL RATIO: 2
CHOLESTEROL: 145 mg/dL (ref 0–200)
HDL: 65.8 mg/dL (ref >39.00)
LDL Cholesterol: 52 mg/dL (ref 0–99)
NonHDL: 78.73
Triglycerides: 136 mg/dL (ref 0.0–149.0)
VLDL: 27.2 mg/dL (ref 0.0–40.0)

## 2018-08-28 LAB — TSH: TSH: 5.02 u[IU]/mL — AB (ref 0.35–4.50)

## 2018-08-28 LAB — VITAMIN D 25 HYDROXY (VIT D DEFICIENCY, FRACTURES): VITD: 51.33 ng/mL (ref 30.00–100.00)

## 2018-08-28 NOTE — Patient Instructions (Addendum)
Checking labs/head CT and referring to neurology for memory/behavioral changes.   See you back in 1 month for blood pressure check

## 2018-08-28 NOTE — Progress Notes (Signed)
Patient: Jasmine Williams MRN: [id number] DOB: 01/25/1941  PCP: Jasmine Flaming, MD     Subjective:  Chief Complaint  Patient presents with  . Establish Care    HPI: The patient is a 78 y.o. female who presents today for annual exam. She denies any changes to past medical history. There have been no recent hospitalizations. They are not following a well balanced diet and exercise plan. Weight has been stable. She is accompanied by her son and daughter who have concerns of memory loss and anger and odd thoughts associated with the trash. Jasmine Williams is very quick to anger and very easily irritable during our appointment and with all staff as well as with her kids. She tells me she doesn't want to be here and this irritates her.   She has no concerns. Her son and daughter tell me that her husband died in 15-Jun-2023 of last year, but there was concerns with her memory before he died. He had voiced concerns before he passed away, but they don't have examples from him. The son states she no longer drives. When she was driving she would just get out of the car and go into church and leave the car running. She blames the car. She also tells me she stopped driving because she may get lost. She lives in her home, but her son moved down from [place] and is living with her. She doesn't cook and doesn't each much throughout the day. Daughter will go grocery shopping with her. She eats mainly cold shrimp, ice cream, animal crackers and trail mix. Weight has been relatively stable. Her anger is an issue and so much so her son can't live with her much longer. Other concerns include lack of showering/self care. Son states he went into her bathroom and there was so much mold in it and when he asked her she states she didn't remember how long it had been. She just sponges off. When I ask her about this, she doesn't say much. The son and daughter state she has always been lacking patience, but her anger and  irritability have become quite bad. She is also obsesses with trash and has some bizarre thoughts. No si/hi. Denies any ah/vh.   History of alzheimer's in her father.   Immunization History  Administered Date(s) Administered  . Tdap 05/26/2011   Colonoscopy: never  Mammogram: 06/2016 Pap smear: "long time"  Tdap: 2012 pneumonia shots: she doesn't think she ever had.    Review of Systems  Constitutional: Negative for fatigue.  Respiratory: Negative for shortness of breath.   Cardiovascular: Negative for chest pain.  Gastrointestinal: Negative for abdominal pain and nausea.  Musculoskeletal: Negative for back pain and neck pain.  Skin: Negative for rash.  Neurological: Negative for dizziness and headaches.  Psychiatric/Behavioral: Negative for dysphoric mood and sleep disturbance. The patient is nervous/anxious.     Allergies Patient has No Known Allergies.  Past Medical History Patient  has a past medical history of Cancer ([place]) and No pertinent past medical history.  Surgical History Patient  has a past surgical history that includes Cholecystectomy; broken leg; and Breast lumpectomy.  Family History [name] family history includes Cancer in her sister.  Social History Patient  reports that she quit smoking about 47 years ago. She has never used smokeless tobacco. She reports current alcohol use. She reports that she does not use drugs.    Objective: Vitals:   08/28/18 1256 08/28/18 1403  BP: (!) 154/80 (!) 164/100  Pulse: 93   Temp: 98 F (36.7 C)   TempSrc: Oral   SpO2: 96%   Weight: 105 lb 12.8 oz (48 kg)   Height: 5' (1.524 m)     Body mass index is 20.66 kg/m.  Physical Exam Vitals signs reviewed.  Constitutional:      Appearance: Normal appearance. She is well-developed.  HENT:     Right Ear: External ear normal.     Left Ear: External ear normal.     Ears:     Comments: Hearing aides  Hard of hearing     Mouth/Throat:     Mouth: Mucous  membranes are moist.  Eyes:     Conjunctiva/sclera: Conjunctivae normal.     Pupils: Pupils are equal, round, and reactive to light.  Neck:     Musculoskeletal: Normal range of motion and neck supple.     Thyroid: No thyromegaly.  Cardiovascular:     Rate and Rhythm: Normal rate and regular rhythm.     Pulses: Normal pulses.     Heart sounds: Normal heart sounds. No murmur.  Pulmonary:     Effort: Pulmonary effort is normal.     Breath sounds: Normal breath sounds.  Abdominal:     General: Abdomen is flat. Bowel sounds are normal. There is no distension.     Palpations: Abdomen is soft.     Tenderness: There is no abdominal tenderness.  Lymphadenopathy:     Cervical: No cervical adenopathy.  Skin:    General: Skin is warm and dry.     Findings: No rash.  Neurological:     Mental Status: She is alert and oriented to person, place, and time.     Cranial Nerves: No cranial nerve deficit.     Coordination: Coordination normal.     Deep Tendon Reflexes: Reflexes normal.  Psychiatric:     Comments: Irritable, quick to anger     Depression screen [facility] 2/9 08/28/2018  Decreased Interest 0  Down, Depressed, Hopeless 0  PHQ - 2 Score 0   MMSE: 29/30     Assessment/plan: 1. Memory loss Concern for some memory issues, but also some behavioral issues. Discussed with son and daughter my concerns. Will scan head with CT to rule out tumor/tia/cva or other structural cause and check labs. Also referring to neurology as her history has some components of memory loss although MMSE is normal. Will let them see about MRI. Discussed can see other psych issues late onset in life and she has some of these features that make me concerned for this. Did discuss anti depressant with her today and she is completely against this. Will address again at follow up and see how her neurology appointment goes. Recommended family start looking into LTC facilities and what their supplemental insurance will cover  especially if son knows he can't live with her long term. She does not sound like she is not safe at home alone with her lack of showering and inability to take care or her home. Home health or a visiting angel service may also be helpful, but afraid patient would not cooperate.    - CBC with Differential/Platelet - Comprehensive metabolic panel - TSH - Urinalysis, Routine w reflex microscopic - Urine Culture - Ambulatory referral to Neurology - CT Head Wo Contrast; Future  2. Elevated blood pressure reading elevated readings. Will bring her back in one month. Daughter is going to get a cuff and keep a log for me. Likely will need medication.  3. Vitamin D deficiency  - VITAMIN D 25 Hydroxy (Vit-D Deficiency, Fractures)  4. Screening for lipid disorders  - Lipid panel   >40 minutes spent in face to face time with counseling, MMSE administration and talking with family.   Return in about 1 month (around 09/26/2018) for blood pressure check .     Jasmine Flaming, MD [place]  08/28/2018

## 2018-08-30 LAB — URINE CULTURE
MICRO NUMBER:: 226782
SPECIMEN QUALITY:: ADEQUATE

## 2018-08-31 ENCOUNTER — Other Ambulatory Visit: Payer: Self-pay | Admitting: Family Medicine

## 2018-08-31 MED ORDER — CEFUROXIME AXETIL 500 MG PO TABS: 500.0000 mg | ORAL_TABLET | Freq: Two times a day (BID) | ORAL | 0 refills | Status: DC

## 2018-09-07 ENCOUNTER — Ambulatory Visit
Admission: RE | Admit: 2018-09-07 | Discharge: 2018-09-07 | Disposition: A | Discharge status: Home / Self Care | Payer: Medicare Other | Source: Ambulatory Visit | Attending: Family Medicine | Admitting: Family Medicine

## 2018-09-07 DIAGNOSIS — R413 Other amnesia: Secondary | ICD-10-CM

## 2018-09-30 ENCOUNTER — Ambulatory Visit: Payer: Medicare Other | Admitting: Family Medicine

## 2018-11-09 ENCOUNTER — Ambulatory Visit: Payer: Medicare Other | Admitting: Family Medicine

## 2018-11-25 ENCOUNTER — Ambulatory Visit (INDEPENDENT_AMBULATORY_CARE_PROVIDER_SITE_OTHER): Payer: Medicare Other

## 2018-11-25 ENCOUNTER — Encounter: Payer: Self-pay | Admitting: Family Medicine

## 2018-11-25 ENCOUNTER — Ambulatory Visit (INDEPENDENT_AMBULATORY_CARE_PROVIDER_SITE_OTHER): Payer: Medicare Other | Admitting: Family Medicine

## 2018-11-25 ENCOUNTER — Other Ambulatory Visit: Payer: Self-pay

## 2018-11-25 VITALS — BP 138/82 | HR 106 | Temp 98.0°F | Ht 60.0 in | Wt 96.4 lb

## 2018-11-25 DIAGNOSIS — M546 Pain in thoracic spine: Secondary | ICD-10-CM | POA: Diagnosis not present

## 2018-11-25 DIAGNOSIS — R3121 Asymptomatic microscopic hematuria: Secondary | ICD-10-CM

## 2018-11-25 DIAGNOSIS — R946 Abnormal results of thyroid function studies: Secondary | ICD-10-CM

## 2018-11-25 DIAGNOSIS — R030 Elevated blood-pressure reading, without diagnosis of hypertension: Secondary | ICD-10-CM

## 2018-11-25 DIAGNOSIS — R413 Other amnesia: Secondary | ICD-10-CM

## 2018-11-25 DIAGNOSIS — D75839 Thrombocytosis, unspecified: Secondary | ICD-10-CM

## 2018-11-25 DIAGNOSIS — R300 Dysuria: Secondary | ICD-10-CM

## 2018-11-25 DIAGNOSIS — D473 Essential (hemorrhagic) thrombocythemia: Secondary | ICD-10-CM

## 2018-11-25 DIAGNOSIS — D72829 Elevated white blood cell count, unspecified: Secondary | ICD-10-CM

## 2018-11-25 LAB — URINALYSIS, ROUTINE W REFLEX MICROSCOPIC
Ketones, ur: 40 — AB
Nitrite: NEGATIVE
Specific Gravity, Urine: >=1.03 — AB (ref 1.000–1.030)
Total Protein, Urine: 100 — AB
Urine Glucose: NEGATIVE
Urobilinogen, UA: 0.2 (ref 0.0–1.0)
pH: 5 (ref 5.0–8.0)

## 2018-11-25 LAB — CBC WITH DIFFERENTIAL/PLATELET
Basophils Absolute: 0.1 10*3/uL (ref 0.0–0.1)
Basophils Relative: 0.6 % (ref 0.0–3.0)
Eosinophils Absolute: 0.1 10*3/uL (ref 0.0–0.7)
Eosinophils Relative: 0.5 % (ref 0.0–5.0)
HCT: 32.3 % — ABNORMAL LOW (ref 36.0–46.0)
Hemoglobin: 10.6 g/dL — ABNORMAL LOW (ref 12.0–15.0)
Lymphocytes Relative: 8.2 % — ABNORMAL LOW (ref 12.0–46.0)
Lymphs Abs: 1.3 10*3/uL (ref 0.7–4.0)
MCHC: 32.7 g/dL (ref 30.0–36.0)
MCV: 84.7 fl (ref 78.0–100.0)
Monocytes Absolute: 0.9 10*3/uL (ref 0.1–1.0)
Monocytes Relative: 5.6 % (ref 3.0–12.0)
Neutro Abs: 13.7 10*3/uL — ABNORMAL HIGH (ref 1.4–7.7)
Neutrophils Relative %: 85.1 % — ABNORMAL HIGH (ref 43.0–77.0)
Platelets: 985 10*3/uL — ABNORMAL HIGH (ref 150.0–400.0)
RBC: 3.81 Mil/uL — ABNORMAL LOW (ref 3.87–5.11)
RDW: 14.6 % (ref 11.5–15.5)
WBC: 16.1 10*3/uL — ABNORMAL HIGH (ref 4.0–10.5)

## 2018-11-25 LAB — COMPREHENSIVE METABOLIC PANEL
ALT: 8 U/L (ref 0–35)
AST: 9 U/L (ref 0–37)
Albumin: 3.6 g/dL (ref 3.5–5.2)
Alkaline Phosphatase: 68 U/L (ref 39–117)
BUN: 24 mg/dL — ABNORMAL HIGH (ref 6–23)
CO2: 22 mEq/L (ref 19–32)
Calcium: 8.7 mg/dL (ref 8.4–10.5)
Chloride: 105 mEq/L (ref 96–112)
Creatinine, Ser: 1.16 mg/dL (ref 0.40–1.20)
GFR: 45.31 mL/min — ABNORMAL LOW (ref >60.00)
Glucose, Bld: 96 mg/dL (ref 70–99)
Potassium: 4.3 mEq/L (ref 3.5–5.1)
Sodium: 141 mEq/L (ref 135–145)
Total Bilirubin: 0.5 mg/dL (ref 0.2–1.2)
Total Protein: 6.2 g/dL (ref 6.0–8.3)

## 2018-11-25 LAB — TSH: TSH: 8.02 u[IU]/mL — ABNORMAL HIGH (ref 0.35–4.50)

## 2018-11-25 MED ORDER — TRAMADOL HCL 50 MG PO TABS: 50.0000 mg | ORAL_TABLET | Freq: Three times a day (TID) | ORAL | 0 refills | Status: AC | PRN

## 2018-11-25 NOTE — Patient Instructions (Addendum)
Back pain:   No obvious fracture on xray. Sending stat, so I can get radiology to read today and make sure im not missing anything  -can continue aleve, but if she is taking too much please make sure you are giving it to her as she can really damage/shock her kidneys. Checking them today  -tramadol given for pain. Can take this every 8 hours as needed for pain. Please give this to her. If she takes too much can suppress breathing as it's in the opioid family. Short supply given  -if pain continues or gets worse, please take to ER.   -would do a follow up appointment for memory with son/family.

## 2018-11-25 NOTE — Progress Notes (Signed)
Patient: Jasmine Williams MRN: [id number] DOB: [birth year] PCP: Orma Flaming, MD     Subjective:  Chief Complaint  Patient presents with  . Hypertension  . upper back pain    x 2 wks    HPI: The patient is a 78 y.o. female who presents today for hypertension, Also c/o worsening upper back pain x 4 wks-appears to be in great distress at time of visit. Very rude and told if she doesn't let me do my job and be kind we would not be able to continue visit.   Hypertension: Saw her a month ago  And they were going to keep a log, but didn't do this. Daughter is with her today. No log, but much improved.    Mid back pain: Daughter thinks this has been going on x a few weeks (likely 4 weeks).  She can't remember if she did anything. No pain with deep breathing. Denies any falls. Pain rated as a 10/10. Pain does not radiate anywhere. She has taken aleve like crazy and they think she has taken way too much aleve. The aleve does help her some. She is very argumentative and rude during history taking. No pain with arm movement. Pain makes her feel like she wants to faint. No pain with deep inspiration. Located on lateral right mid back, around lat muscle. History of right breast cancer.  Review of Systems  Constitutional: Negative for chills and fever.  Eyes: Negative for visual disturbance.  Respiratory: Negative for shortness of breath.   Cardiovascular: Negative for chest pain.  Gastrointestinal: Negative for abdominal pain and nausea.  Musculoskeletal: Positive for back pain. Negative for neck pain and neck stiffness.       C/o upper back pain that does not radiate to mid or lower back or radiate down arms.  No h/o injury  Neurological: Negative for dizziness and headaches.  Psychiatric/Behavioral: Negative for sleep disturbance.    Allergies Patient has No Known Allergies.  Past Medical History Patient  has a past medical history of Cancer ([place]), History of chicken pox, History of  recurrent UTIs, and No pertinent past medical history.  Surgical History Patient  has a past surgical history that includes Cholecystectomy; broken leg; and Breast lumpectomy.  Family History [name] family history includes Cancer in her sister.  Social History Patient  reports that she quit smoking about 47 years ago. She has never used smokeless tobacco. She reports current alcohol use. She reports that she does not use drugs.    Objective: Vitals:   11/25/18 1046  BP: 138/82  Pulse: (!) 106  Temp: 98 F (36.7 C)  TempSrc: Oral  SpO2: 98%  Weight: 96 lb 6.4 oz (43.7 kg)  Height: 5' (1.524 m)    Body mass index is 18.83 kg/m.  Physical Exam Vitals signs reviewed.  Constitutional:      Appearance: Normal appearance.  Cardiovascular:     Rate and Rhythm: Normal rate and regular rhythm.     Heart sounds: Normal heart sounds.  Pulmonary:     Effort: Pulmonary effort is normal.     Breath sounds: Normal breath sounds.  Abdominal:     General: Abdomen is flat. Bowel sounds are normal.     Palpations: Abdomen is soft.  Musculoskeletal:     Comments: TTP over right latissimus dorsi and right lateral throacic ribs. No pain over scapula or shoulder. No pain in lower back.  Right arm ROM and shoulder intact.   Neurological:  Mental Status: She is alert.     Comments: Memory loss, anger      CXR/thoracic xray: no acute fractures that I can see. No lesions. F/u on official read.     Assessment/plan: 1. Acute right-sided thoracic back pain No fracture or abnormality seen on CXR. Checking labs today and treating conservatively with heating pad, aleve (checking renal function) and tramadol. Discussed very firmly with daughter that they need to lock up aleve and tramadol and give only as written since she is at risk for overdose with memory issues. They will keep this locked up. Very strict ER precautions given. If pain not improved, they need to go to ER.  - DG Thoracic  Spine W/Swimmers; Future - DG Chest 2 View; Future  2. Asymptomatic microscopic hematuria Repeat ua. Had urine infection at that time. Family states they gave full course of medication prescribed.  Will repeat today.  - Urinalysis, Routine w reflex microscopic - Comprehensive metabolic panel - CBC with Differential/Platelet  3. Abnormal thyroid function test Repeat tsh  - TSH  4. Elevated blood pressure reading To goal today x 2 readings. Again, asked family to continue with log at home which they never even started.   5. Memory loss -daughter came into office/room while patient was getting labs and asked if I could prescribe Mrs. Zaro a mood medication, but lie to her and tell her it's for the back pain. I discussed with daughter that this is unethical and I would not do this. Again, I discussed that its imperative we all sit down together and make an informed decision that patient is part of. I talked to them at last appointment that we would need to do a memory follow up and they haven't done this. I am going to do a neurology referral for memory loss and discussed hopefully Mrs. Dina will agree to this. Again, I wonder if she has memory loss +mental health issues as she had some paranoia issues at last visit and I wonder about possible depression vs. Schizophrenia. I think she would benefit from a mood drug, but again I will not prescribe this without further eval and full knowledge, consent and participation of Mrs. Jungman. Recommend f/u in next 2 weeks for memory/mood and f/u with neurology.     Return in about 2 weeks (around 12/09/2018) for memory visit with family .  Orma Flaming, MD [place]   11/26/2018

## 2018-11-26 ENCOUNTER — Other Ambulatory Visit: Payer: Self-pay | Admitting: Family Medicine

## 2018-11-26 DIAGNOSIS — D72829 Elevated white blood cell count, unspecified: Secondary | ICD-10-CM

## 2018-11-26 DIAGNOSIS — D75839 Thrombocytosis, unspecified: Secondary | ICD-10-CM

## 2018-11-26 DIAGNOSIS — R300 Dysuria: Secondary | ICD-10-CM

## 2018-11-26 DIAGNOSIS — D473 Essential (hemorrhagic) thrombocythemia: Secondary | ICD-10-CM

## 2018-11-27 ENCOUNTER — Telehealth: Payer: Self-pay

## 2018-11-27 ENCOUNTER — Other Ambulatory Visit: Payer: Self-pay | Admitting: Family Medicine

## 2018-11-27 DIAGNOSIS — N3000 Acute cystitis without hematuria: Secondary | ICD-10-CM

## 2018-11-27 DIAGNOSIS — D508 Other iron deficiency anemias: Secondary | ICD-10-CM

## 2018-11-27 LAB — URINE CULTURE

## 2018-11-27 LAB — PATHOLOGIST SMEAR REVIEW

## 2018-11-27 LAB — TIQ-NTM

## 2018-11-27 MED ORDER — LEVOTHYROXINE SODIUM 25 MCG PO TABS: 25.0000 ug | ORAL_TABLET | Freq: Every day | ORAL | 0 refills | Status: AC

## 2018-11-27 MED ORDER — AMOXICILLIN-POT CLAVULANATE 875-125 MG PO TABS: 1.0000 | ORAL_TABLET | Freq: Two times a day (BID) | ORAL | 0 refills | Status: DC

## 2018-11-27 NOTE — Progress Notes (Signed)
Called son with results. They can't come in for repeat urine for culture. Plan as below.   1) sending in augmentin for urine infection. Last culture was sensitive to this. Repeat culture/ua next week even though will be altered with abx use 2) checking iron studies, FOBT due to recent anemia 3) starting thyroid medication. Repeat labs in 6-8 weeks.   Discussed labs and concerns that I had. Her dementia is progressing and discussed I did an neurology referral for them at her appointment. Strict ER precautions given.  Orma Flaming, MD [place]

## 2018-11-27 NOTE — Telephone Encounter (Signed)
Spoke to patient's son on 5/21 and reviewed patient's xray results and recommendations per Dr. Rogers Blocker.  Patient's son, Jasmine Williams verbalized understanding.

## 2018-11-27 NOTE — Telephone Encounter (Signed)
Copied from [place] [id number]. Topic: General - Call Back - No Documentation >> Nov 26, 2018 12:15 PM Erick Blinks wrote: Reason for CRM: Pt's son Annie Main called requesting call back from [employer]: [phone]

## 2018-12-03 ENCOUNTER — Other Ambulatory Visit: Payer: Self-pay

## 2018-12-03 ENCOUNTER — Other Ambulatory Visit (INDEPENDENT_AMBULATORY_CARE_PROVIDER_SITE_OTHER): Payer: Medicare Other

## 2018-12-03 DIAGNOSIS — D508 Other iron deficiency anemias: Secondary | ICD-10-CM | POA: Diagnosis not present

## 2018-12-03 DIAGNOSIS — N3000 Acute cystitis without hematuria: Secondary | ICD-10-CM | POA: Diagnosis not present

## 2018-12-03 LAB — CBC WITH DIFFERENTIAL/PLATELET
Basophils Absolute: 0.1 10*3/uL (ref 0.0–0.1)
Basophils Relative: 1.3 % (ref 0.0–3.0)
Eosinophils Absolute: 0.1 10*3/uL (ref 0.0–0.7)
Eosinophils Relative: 0.9 % (ref 0.0–5.0)
HCT: 33.5 % — ABNORMAL LOW (ref 36.0–46.0)
Hemoglobin: 11.2 g/dL — ABNORMAL LOW (ref 12.0–15.0)
Lymphocytes Relative: 17.8 % (ref 12.0–46.0)
Lymphs Abs: 2 10*3/uL (ref 0.7–4.0)
MCHC: 33.6 g/dL (ref 30.0–36.0)
MCV: 83.5 fl (ref 78.0–100.0)
Monocytes Absolute: 0.7 10*3/uL (ref 0.1–1.0)
Monocytes Relative: 6.6 % (ref 3.0–12.0)
Neutro Abs: 8.2 10*3/uL — ABNORMAL HIGH (ref 1.4–7.7)
Neutrophils Relative %: 73.4 % (ref 43.0–77.0)
Platelets: 910 10*3/uL — ABNORMAL HIGH (ref 150.0–400.0)
RBC: 4.01 Mil/uL (ref 3.87–5.11)
RDW: 15 % (ref 11.5–15.5)
WBC: 11.2 10*3/uL — ABNORMAL HIGH (ref 4.0–10.5)

## 2018-12-03 LAB — IBC PANEL
Iron: 31 ug/dL — ABNORMAL LOW (ref 42–145)
Saturation Ratios: 10.5 % — ABNORMAL LOW (ref 20.0–50.0)
Transferrin: 210 mg/dL — ABNORMAL LOW (ref 212.0–360.0)

## 2018-12-04 ENCOUNTER — Other Ambulatory Visit: Payer: Medicare Other

## 2018-12-04 LAB — POCT URINALYSIS DIPSTICK
Bilirubin, UA: NEGATIVE
Glucose, UA: NEGATIVE
Nitrite, UA: NEGATIVE
Protein, UA: POSITIVE — AB
Spec Grav, UA: >=1.03 — AB (ref 1.010–1.025)
Urobilinogen, UA: 0.2 E.U./dL
pH, UA: 6 (ref 5.0–8.0)

## 2018-12-05 LAB — URINE CULTURE
MICRO NUMBER:: 518996
Result:: NO GROWTH
SPECIMEN QUALITY:: ADEQUATE

## 2018-12-07 ENCOUNTER — Other Ambulatory Visit: Payer: Self-pay | Admitting: Family Medicine

## 2018-12-07 DIAGNOSIS — D473 Essential (hemorrhagic) thrombocythemia: Secondary | ICD-10-CM

## 2018-12-07 DIAGNOSIS — D75839 Thrombocytosis, unspecified: Secondary | ICD-10-CM | POA: Insufficient documentation

## 2018-12-11 ENCOUNTER — Telehealth: Payer: Self-pay | Admitting: Oncology

## 2018-12-11 ENCOUNTER — Telehealth: Payer: Self-pay | Admitting: Family Medicine

## 2018-12-11 NOTE — Telephone Encounter (Signed)
Attempted to give pt's son Jasmine Williams results from 12/04/18 but call was need due to [place] receiving an incoming call from the office. Call was ended before Jasmine Williams was given all the results and recommendations. Unable to chart information in result note.

## 2018-12-11 NOTE — Telephone Encounter (Signed)
Scheduled appt per 6/5 sch message -pt aware of appt date and time

## 2018-12-14 ENCOUNTER — Other Ambulatory Visit: Payer: Self-pay

## 2018-12-14 ENCOUNTER — Inpatient Hospital Stay: Payer: Medicare Other

## 2018-12-14 ENCOUNTER — Inpatient Hospital Stay: Payer: Medicare Other | Attending: Oncology | Admitting: Oncology

## 2018-12-14 VITALS — BP 143/72 | HR 118 | Resp 18 | Ht 60.0 in | Wt 88.3 lb

## 2018-12-14 DIAGNOSIS — Z87891 Personal history of nicotine dependence: Secondary | ICD-10-CM | POA: Diagnosis not present

## 2018-12-14 DIAGNOSIS — Z9049 Acquired absence of other specified parts of digestive tract: Secondary | ICD-10-CM | POA: Insufficient documentation

## 2018-12-14 DIAGNOSIS — F0390 Unspecified dementia without behavioral disturbance: Secondary | ICD-10-CM | POA: Insufficient documentation

## 2018-12-14 DIAGNOSIS — D649 Anemia, unspecified: Secondary | ICD-10-CM

## 2018-12-14 DIAGNOSIS — Z853 Personal history of malignant neoplasm of breast: Secondary | ICD-10-CM | POA: Diagnosis not present

## 2018-12-14 DIAGNOSIS — F0391 Unspecified dementia with behavioral disturbance: Secondary | ICD-10-CM

## 2018-12-14 DIAGNOSIS — D471 Chronic myeloproliferative disease: Secondary | ICD-10-CM | POA: Diagnosis not present

## 2018-12-14 DIAGNOSIS — Z170 Estrogen receptor positive status [ER+]: Secondary | ICD-10-CM | POA: Insufficient documentation

## 2018-12-14 DIAGNOSIS — C50811 Malignant neoplasm of overlapping sites of right female breast: Secondary | ICD-10-CM | POA: Insufficient documentation

## 2018-12-14 DIAGNOSIS — Z87442 Personal history of urinary calculi: Secondary | ICD-10-CM | POA: Diagnosis not present

## 2018-12-14 DIAGNOSIS — D473 Essential (hemorrhagic) thrombocythemia: Secondary | ICD-10-CM | POA: Diagnosis not present

## 2018-12-14 DIAGNOSIS — R413 Other amnesia: Secondary | ICD-10-CM | POA: Diagnosis not present

## 2018-12-14 DIAGNOSIS — Z79899 Other long term (current) drug therapy: Secondary | ICD-10-CM

## 2018-12-14 DIAGNOSIS — Z803 Family history of malignant neoplasm of breast: Secondary | ICD-10-CM | POA: Diagnosis not present

## 2018-12-14 DIAGNOSIS — D75839 Thrombocytosis, unspecified: Secondary | ICD-10-CM

## 2018-12-14 LAB — CBC WITH DIFFERENTIAL/PLATELET
Abs Immature Granulocytes: 0.05 10*3/uL (ref 0.00–0.07)
Basophils Absolute: 0.1 10*3/uL (ref 0.0–0.1)
Basophils Relative: 1 %
Eosinophils Absolute: 0.1 10*3/uL (ref 0.0–0.5)
Eosinophils Relative: 1 %
HCT: 35.3 % — ABNORMAL LOW (ref 36.0–46.0)
Hemoglobin: 10.9 g/dL — ABNORMAL LOW (ref 12.0–15.0)
Immature Granulocytes: 1 %
Lymphocytes Relative: 16 %
Lymphs Abs: 1.7 10*3/uL (ref 0.7–4.0)
MCH: 27.4 pg (ref 26.0–34.0)
MCHC: 30.9 g/dL (ref 30.0–36.0)
MCV: 88.7 fL (ref 80.0–100.0)
Monocytes Absolute: 1 10*3/uL (ref 0.1–1.0)
Monocytes Relative: 10 %
Neutro Abs: 7.7 10*3/uL (ref 1.7–7.7)
Neutrophils Relative %: 71 %
Platelets: 650 10*3/uL — ABNORMAL HIGH (ref 150–400)
RBC: 3.98 MIL/uL (ref 3.87–5.11)
RDW: 15.8 % — ABNORMAL HIGH (ref 11.5–15.5)
WBC: 10.7 10*3/uL — ABNORMAL HIGH (ref 4.0–10.5)
nRBC: 0 % (ref 0.0–0.2)

## 2018-12-14 LAB — COMPREHENSIVE METABOLIC PANEL
ALT: 8 U/L (ref 0–44)
AST: 17 U/L (ref 15–41)
Albumin: 3.3 g/dL — ABNORMAL LOW (ref 3.5–5.0)
Alkaline Phosphatase: 76 U/L (ref 38–126)
Anion gap: 15 (ref 5–15)
BUN: 13 mg/dL (ref 8–23)
CO2: 21 mmol/L — ABNORMAL LOW (ref 22–32)
Calcium: 9.1 mg/dL (ref 8.9–10.3)
Chloride: 107 mmol/L (ref 98–111)
Creatinine, Ser: 0.86 mg/dL (ref 0.44–1.00)
GFR calc Af Amer: >60 mL/min (ref >60)
GFR calc non Af Amer: >60 mL/min (ref >60)
Glucose, Bld: 97 mg/dL (ref 70–99)
Potassium: 4 mmol/L (ref 3.5–5.1)
Sodium: 143 mmol/L (ref 135–145)
Total Bilirubin: 0.6 mg/dL (ref 0.3–1.2)
Total Protein: 7.1 g/dL (ref 6.5–8.1)

## 2018-12-14 LAB — SAVE SMEAR(SSMR), FOR PROVIDER SLIDE REVIEW

## 2018-12-14 LAB — FOLATE: Folate: 13.4 ng/mL (ref >5.9)

## 2018-12-14 LAB — VITAMIN B12: Vitamin B-12: 394 pg/mL (ref 180–914)

## 2018-12-14 NOTE — Progress Notes (Signed)
[place]  [phone] [phone]    ID: Jasmine Williams   DOB: [birth year]  MR#: [id number]  BHA#:[id number]  Patient Care Team: Orma Flaming, MD as PCP - General (Family Medicine) Garry Nicolini, Virgie Dad, MD as Consulting Physician (Oncology) OTHER MD:  CHIEF COMPLAINT:  Estrogen receptor positive Right Breast Cancer; thrombocytosis  CURRENT TREATMENT: Work-up in progress   INTERVAL HISTORY: Jasmine Williams returns today for evaluation of a new problem, thrombocytosis.  We have platelet counts dating back to 2015 at which time they were normal.  However they have been slightly over 400,000 for the last 4 years.  More recently however they greatly increased to nearly 1 million. Results for PEIGHTON, MEHRA (MRN [id number]) as of 12/14/2018 16:45  Ref. Range 08/16/2013 13:51 08/15/2014 13:13 09/28/2015 12:29 09/26/2016 12:26 08/28/2018 13:42 11/25/2018 12:06 12/03/2018 14:28 12/14/2018 15:44  Platelets Latest Ref Range: 150 - 400 K/uL 366 484 (H) 420 (H) 453 (H) 461.0 (H) 985.0 Repeated and verified X2. (H) 910.0 (H) 650 (H)   She was referred here for further evaluation and treatment of this problem.  Since her last visit, she underwent head CT on 09/07/2018 for evaluation of memory loss and possible dementia, which showed no acute intercranial pathology. She also underwent thoracic spine x-ray and chest x-ray on 11/25/2018 for evaluation of bony pains, both of which were negative.   The last mammogram we have on record is from 07/04/2016 showing breast density category B.  There was no evidence of malignancy.   REVIEW OF SYSTEMS: [name] reports she isn't quite sure why she's here today. She is not doing much during the day. She does crossword puzzles. Her daughter-in-law does the shopping and cooking. She denies any leg swelling. She recently had some back pain that has resolved. She was recently taking antibiotics and has since finished that.  She says "I am not feeling sick.   I feel okay.  I am fine". The patient denies unusual headaches, visual changes, nausea, vomiting, stiff neck, dizziness, or gait imbalance. There has been no cough, phlegm production, or pleurisy, no chest pain or pressure, and no change in bowel or bladder habits. The patient denies fever, rash, bleeding, unexplained fatigue or unexplained weight loss. A detailed review of systems was otherwise entirely negative.   BREAST CANCER HISTORY: From the original intake note:  The patient is a 78 year old [name] who had screening mammography at [facility] in December 2012, showing a possible abnormality in the right breast. She was referred to the breast Center, where right diagnostic mammography 07/10/2011 showed 2 clusters of microcalcifications in the right breast. These were biopsied 07/17/2011, and showed ([id number]) ductal carcinoma in situ as well as invasive ductal carcinoma. The invasive tumor was a 100% estrogen receptor positive, 11% progesterone receptor positive, with an MIB-1 of 75% and HER-2 nonamplified of with a HER-2 : CEP17 ratio of 1.22.   On January 14 the patient underwent bilateral breast MRI at [facility]. This confirmed 2 separate areas of enhancement in the lower right breast measuring a total of 5.6 cm. There were no other suspicious areas in either breast, no enlarged or suspicious lymph nodes, and no suspicious bony or upper hepatic abnormalities.  With this information the patient was referred to Dr. Donne Hazel and after appropriate discussion she underwent right lumpectomy and sentinel lymph node sampling 08/05/2011. The pathology from this procedure ([id number]) confirmed a 4 mm invasive ductal carcinoma, grade 3, with 0 of 2 sentinel lymph nodes  involved. HER-2 was repeated and was equivocal, with a ratio of 1.95. Margins were clear.   Her subsequent history is as detailed below.   PAST MEDICAL HISTORY: Past Medical History:  Diagnosis Date    Cancer ([place])    right breast   History of chicken pox    History of recurrent UTIs    No pertinent past medical history     PAST SURGICAL HISTORY: Past Surgical History:  Procedure Laterality Date   BREAST LUMPECTOMY     right lump, snbx   broken leg     right as a child   CHOLECYSTECTOMY      FAMILY HISTORY Family History  Problem Relation Age of Onset   Cancer Sister        breast  the patient's father died at the age of 4 with Alzheimer's disease. The patient's mother lived to be more than 71 years old. The patient had no brothers. She had 2 sisters, one of whom was diagnosed with breast cancer in her mid 51s. She was not genetically tested as far as the patient is aware. There is no history of ovarian cancer or other breast cancers in the family   GYNECOLOGIC HISTORY: No LMP recorded (lmp unknown). Patient is postmenopausal. Menarche age 76, first pregnancy to term age 89, menopause in her early 74s. The patient is GX P3. She took Prempro for 3-5 years, stopping several years ago   SOCIAL HISTORY:  (Updated 12/14/2018) She used to work in the [employer] but is now retired. Her husband of 50+ years, Jasmine Williams, was a retired [place] policeman. He passed away in 05-29-18. The patient's sons Jasmine Williams and Jasmine Williams of also were policemen. Jasmine Williams is now a [employer] in [place]. Jasmine Williams lives with the patient, together with his wife and son who is currently 50.  The patient's daughter Jasmine Williams, lives in [place] and is a housewife.     ADVANCED DIRECTIVES: The patient specifically stated that she is not going to do a power of attorney.   HEALTH MAINTENANCE:  (Updated February 2015) Social History   Tobacco Use   Smoking status: Former Smoker    Last attempt to quit: 07/25/1971    Years since quitting: 47.4   Smokeless tobacco: Never Used  Substance Use Topics   Alcohol use: Yes    Comment: rare   Drug use: No    No Known Allergies  Current Outpatient Medications    Medication Sig Dispense Refill   alendronate (FOSAMAX) 70 MG tablet TAKE 1 TABLET BY MOUTH EVERY WEEK AS DIRECTED     levothyroxine (SYNTHROID) 25 MCG tablet Take 1 tablet (25 mcg total) by mouth daily before breakfast. Labs in 6-8 weeks 90 tablet 0   Vitamin D, Ergocalciferol, (DRISDOL) 1.25 MG (50000 UT) CAPS capsule 1 TAB EVERY OTHER WEEK     No current facility-administered medications for this visit.     OBJECTIVE: Older white female who appears stated age 51:   12/14/18 1605  BP: (!) 143/72  Pulse: (!) 118  Resp: 18  SpO2: 99%     Body mass index is 17.24 kg/m.    ECOG FS: 0 Filed Weights   12/14/18 1605  Weight: 88 lb 4.8 oz (40.1 kg)   Sclerae unicteric, pupils round and equal No cervical or supraclavicular adenopathy Lungs no rales or rhonchi Heart regular rate and rhythm Abd soft, nontender, positive bowel sounds, no palpable splenomegaly MSK mild scoliosis but no focal spinal tenderness, no upper extremity lymphedema  Neuro: nonfocal, the patient is inappropriately curt and defensive throughout the interview; orientation was not formally tested but she knew her medications, was able to name her family members and what they did, and while during the visit she had to have questions repeated because of hearing problems (she forgot to wear her hearing aid she said) when I met with her and her daughter outside the building she seemed to have little trouble hearing Breasts: The right breast is status post lumpectomy and radiation.  There is no evidence of local recurrence.  The left breast is unremarkable.  Both axillae are benign.  LAB RESULTS: Lab Results  Component Value Date   WBC 10.7 (H) 12/14/2018   NEUTROABS 7.7 12/14/2018   HGB 10.9 (L) 12/14/2018   HCT 35.3 (L) 12/14/2018   MCV 88.7 12/14/2018   PLT 650 (H) 12/14/2018      Chemistry      Component Value Date/Time   NA 143 12/14/2018 1544   NA 142 09/26/2016 1226   K 4.0 12/14/2018 1544   K 3.9  09/26/2016 1226   CL 107 12/14/2018 1544   CL 106 10/20/2012 1449   CO2 21 (L) 12/14/2018 1544   CO2 25 09/26/2016 1226   BUN 13 12/14/2018 1544   BUN 9.8 09/26/2016 1226   CREATININE 0.86 12/14/2018 1544   CREATININE 1.0 09/26/2016 1226      Component Value Date/Time   CALCIUM 9.1 12/14/2018 1544   CALCIUM 9.9 09/26/2016 1226   ALKPHOS 76 12/14/2018 1544   ALKPHOS 123 09/26/2016 1226   AST 17 12/14/2018 1544   AST 17 09/26/2016 1226   ALT 8 12/14/2018 1544   ALT 13 09/26/2016 1226   BILITOT 0.6 12/14/2018 1544   BILITOT 0.77 09/26/2016 1226      STUDIES: Dg Chest 2 View  Result Date: 11/25/2018 CLINICAL DATA:  Right thoracic pain EXAM: CHEST - 2 VIEW COMPARISON:  Chest x-ray dated 07/31/2011 FINDINGS: The cardiac silhouette is unremarkable. There is no pneumothorax. No large pleural effusion. Chronic changes are noted at the lung bases bilaterally. There are surgical staples along the patient's right chest wall. Surgical clips are noted in the right upper quadrant. IMPRESSION: No active cardiopulmonary disease. Electronically Signed   By: Constance Holster M.D.   On: 11/25/2018 15:48   Dg Thoracic Spine W/swimmers  Result Date: 11/25/2018 CLINICAL DATA:  Pain in the right thoracic spine. EXAM: THORACIC SPINE - 3 VIEWS COMPARISON:  Chest x-ray dated 07/31/2011. FINDINGS: There is no evidence of thoracic spine fracture. Alignment is normal. No other significant bone abnormalities are identified. IMPRESSION: Negative. Electronically Signed   By: Constance Holster M.D.   On: 11/25/2018 15:47     ASSESSMENT: 78 y.o. [place] woman   (1)  status post right lumpectomy and sentinel lymph node sampling January of 2013 for a T1a N0, stage I A invasive ductal carcinoma, grade 3, estrogen and progesterone receptor positive, with equivocal HER-2 amplification, and an elevated MIB-1.   (2)  She completed radiation therapy April 2013 and started letrozole at that time, completed 5 years  march 2018  (a) DEXA scan 06/27/2014 showed a T score of -1.9  (3) THROMBOCYTOSIS: Platelet count 995.0 on 11/25/2018, 910 on repeat a week later  (4)   PLAN: Teddy has had a persistent but mild thrombocytosis for the past 4 years.  And rose from 561.0 February of this year to 985.0 in May.  However today it is trending down at 650.  The  total white cell count is 10.7 with an unremarkable differential.  Hemoglobin is 10.9 with an MCV of 88.7.  Review of the peripheral blood film today shows the red cells to be unremarkable, with no schistocytes, tailed poikilocytes or nucleated red blood cells.  The red cells may be minimally hypo-high globin eyes.  Review of the smear confirms the thrombocytosis.  The white cell series shows the neutrophils in particular to be hypo-granulated with many showing Pelger-Hut anomalies.  There is no significant left shift.  The most common causes of thrombocytosis are reactive, namely iron deficiency, infection, or inflammation.  The patient had a positive urine culture for E. coli in February.  She was treated with Ceftin.  Repeat urinalysis 11/25/2018 was still abnormal, showing many white cells and red cells, but no nitrite.  She was treated with a second round of antibiotics.  Currently the patient denies any urinary symptoms or any symptoms of infection.  Her platelets appear to be trending back to her baseline.  Nevertheless she has had a persistent thrombocytosis, although mild, for the past several years, and she has some changes in the white cell series consistent with an underlying myeloproliferative disorder, namely essential thrombocytosis.  The white cells are only mildly elevated.  I do not palpate a spleen.  I did not obtain confirmatory tests for essential thrombocytosis today because those tests are very expensive and I would not want the patient to end up with unnecessary costs.  However pending results of the other tests today those tests (Jak 2 in  particular) would be next.  The patient was very uncomfortable with today's visit, insisted several times that she was fine, stated that she did not need to come back here, and acted in an inappropriately suspicious and irritable manner.  She expressed no curiosity or concern regarding seeing a hematologist and oncologist.  I understand that neurologic evaluation is planned and I think this is entirely appropriate.  I discussed some of these issues with the patient's daughter Jasmine Williams and I gave her a written information on thrombocytosis.  I suggested it would be best if 1 of the children were the communicator so that the patient's physicians were always talked to the same person who would then pass on the information to the rest of the family.  Nicole Kindred felt she would probably be that person since she is the one who drives the patient around to doctors meetings.  I will schedule the patient in 4 weeks for additional lab work.  If we do eventually diagnose essential thrombocytosis we will have to consider whether to start the patient on low-dose aspirin and/or Hydrea  Sarajane Jews C. Latanga Nedrow MD Oncology and Hematology [facility] Tel. [phone]  FAX [phone]   I, Wilburn Mylar, am acting as scribe for Dr. Virgie Dad. Lamara Brecht.  I, Lurline Del MD, have reviewed the above documentation for accuracy and completeness, and I agree with the above.

## 2018-12-15 ENCOUNTER — Telehealth: Payer: Self-pay | Admitting: Oncology

## 2018-12-15 LAB — RETICULOCYTES
Immature Retic Fract: 11 % (ref 2.3–15.9)
RBC.: 4.01 MIL/uL (ref 3.87–5.11)
Retic Count, Absolute: 77.8 10*3/uL (ref 19.0–186.0)
Retic Ct Pct: 1.9 % (ref 0.4–3.1)

## 2018-12-15 LAB — IRON AND TIBC
Iron: 14 ug/dL — ABNORMAL LOW (ref 41–142)
Saturation Ratios: 5 % — ABNORMAL LOW (ref 21–57)
TIBC: 260 ug/dL (ref 236–444)
UIBC: 246 ug/dL (ref 120–384)

## 2018-12-15 LAB — FERRITIN: Ferritin: 52 ng/mL (ref 11–307)

## 2018-12-15 LAB — THYROID PANEL WITH TSH
Free Thyroxine Index: 2.6 (ref 1.2–4.9)
T3 Uptake Ratio: 27 % (ref 24–39)
T4, Total: 9.8 ug/dL (ref 4.5–12.0)
TSH: 2.7 u[IU]/mL (ref 0.450–4.500)

## 2018-12-15 LAB — C-REACTIVE PROTEIN: CRP: 4 mg/dL — ABNORMAL HIGH (ref <1.0)

## 2018-12-15 NOTE — Telephone Encounter (Signed)
Talk with patient regarding schedule °

## 2018-12-18 ENCOUNTER — Encounter: Payer: Self-pay | Admitting: Oncology

## 2018-12-18 ENCOUNTER — Other Ambulatory Visit: Payer: Self-pay

## 2018-12-18 ENCOUNTER — Encounter: Payer: Self-pay | Admitting: Family Medicine

## 2018-12-18 ENCOUNTER — Ambulatory Visit (INDEPENDENT_AMBULATORY_CARE_PROVIDER_SITE_OTHER): Payer: Medicare Other | Admitting: Family Medicine

## 2018-12-18 VITALS — BP 138/78 | HR 116 | Temp 97.7°F | Ht 60.0 in | Wt 86.8 lb

## 2018-12-18 DIAGNOSIS — F0391 Unspecified dementia with behavioral disturbance: Secondary | ICD-10-CM | POA: Diagnosis not present

## 2018-12-18 DIAGNOSIS — K2900 Acute gastritis without bleeding: Secondary | ICD-10-CM | POA: Diagnosis not present

## 2018-12-18 DIAGNOSIS — F321 Major depressive disorder, single episode, moderate: Secondary | ICD-10-CM | POA: Diagnosis not present

## 2018-12-18 DIAGNOSIS — M546 Pain in thoracic spine: Secondary | ICD-10-CM | POA: Diagnosis not present

## 2018-12-18 MED ORDER — OMEPRAZOLE MAGNESIUM 20 MG PO TBEC: 20.0000 mg | DELAYED_RELEASE_TABLET | Freq: Every day | ORAL | 2 refills | Status: DC

## 2018-12-18 MED ORDER — TRAMADOL HCL 50 MG PO TABS: 50.0000 mg | ORAL_TABLET | Freq: Three times a day (TID) | ORAL | 0 refills | Status: AC | PRN

## 2018-12-18 MED ORDER — MIRTAZAPINE 15 MG PO TABS: 15.0000 mg | ORAL_TABLET | Freq: Every day | ORAL | 2 refills | Status: DC

## 2018-12-18 NOTE — Progress Notes (Signed)
Subjective  CC:  Chief Complaint  Patient presents with  . Back Pain   Same day acute visit; PCP not available. New pt to me. Chart reviewed.   HPI: Jasmine Williams is a 78 y.o. female who presents to the office today to address the problems listed above in the chief complaint.  78 yo female w/ h/o breast cancer, newly diagnosed dementia, and widowed in Nov 2019 presents with her daughter due to persistent right upper back pain. Pt is a vague historian, I believe due to memory loss, but says she ran out of her pain medication 2 days ago and her back pain is worse. I reviewed the note from may when the thoracic back pain was first addressed. Nl thoracic spine xrays and neg CXR was done at that time. Pt cannot give any more details about her pain.   Daughter notices she is not eating; has lost 10 pounds; low mood and motivation.   On fosamax for osteoporosis; belching and spitting more frequently. Denies abdominal pain.    Assessment  1. Acute right-sided thoracic back pain   2. Dementia with behavioral disturbance, unspecified dementia type ([place])   3. Other acute gastritis without hemorrhage      Plan   Back pain:  Unclear etiology: diff dx includes compression fracture, gastritis with radiating pain, msk or other. Recent labs from onc show nl renal and liver function. Restart the tramadol and follow. Further eval if not improving.   Dementia: has referral to neuro. Will likely benefit from memory medication  Presumed gerd/gastritis as side effect of fosamax: hold fosamax and start daily PPI  Depression, new: start remeron for sleep, appetite stimluation and mood.   Follow up: Return in about 4 weeks (around 01/15/2019) for recheck.  Visit date not found  No orders of the defined types were placed in this encounter.  Meds ordered this encounter  Medications  . traMADol (ULTRAM) 50 MG tablet    Sig: Take 1 tablet (50 mg total) by mouth 3 (three) times daily as needed for up  to 5 days.    Dispense:  30 tablet    Refill:  0  . mirtazapine (REMERON) 15 MG tablet    Sig: Take 1 tablet (15 mg total) by mouth at bedtime.    Dispense:  30 tablet    Refill:  2  . omeprazole (PRILOSEC OTC) 20 MG tablet    Sig: Take 1 tablet (20 mg total) by mouth daily.    Dispense:  30 tablet    Refill:  2      I reviewed the patients updated PMH, FH, and SocHx.    Patient Active Problem List   Diagnosis Date Noted  . Malignant neoplasm of overlapping sites of right breast in female, estrogen receptor positive ([place]) 12/14/2018  . Anemia 12/14/2018  . Dementia ([place]) 12/14/2018  . Thrombocytosis ([place]) 12/07/2018  . History of breast cancer 08/23/2013  . Postmenopausal estrogen deficiency 08/23/2013  . Osteopenia 08/23/2013  . Unspecified vitamin D deficiency 11/06/2012   Current Meds  Medication Sig  . acetaminophen (TYLENOL) 500 MG tablet Take 1,000 mg by mouth every 6 (six) hours as needed.  Marland Kitchen alendronate (FOSAMAX) 70 MG tablet TAKE 1 TABLET BY MOUTH EVERY WEEK AS DIRECTED  . levothyroxine (SYNTHROID) 25 MCG tablet Take 1 tablet (25 mcg total) by mouth daily before breakfast. Labs in 6-8 weeks  . Vitamin D, Ergocalciferol, (DRISDOL) 1.25 MG (50000 UT) CAPS capsule 1 TAB EVERY OTHER  WEEK    Allergies: Patient has No Known Allergies. Family History: Patient family history includes Cancer in her sister. Social History:  Patient  reports that she quit smoking about 47 years ago. She has never used smokeless tobacco. She reports current alcohol use. She reports that she does not use drugs.  Review of Systems: Constitutional: Negative for fever malaise or anorexia Cardiovascular: negative for chest pain Respiratory: negative for SOB or persistent cough Gastrointestinal: negative for abdominal pain  Objective  Vitals: BP 138/78 (BP Location: Left Arm, Patient Position: Sitting, Cuff Size: Normal)   Pulse (!) 116   Temp 97.7 F (36.5 C) (Oral)   Ht 5' (1.524 m)    Wt 86 lb 12.8 oz (39.4 kg)   LMP  (LMP Unknown)   SpO2 98%   BMI 16.95 kg/m  General: appears uncomfotable , A&Ox3, kyphotic HEENT: PEERL, conjunctiva normal, Oropharynx moist,neck is supple Cardiovascular:  RRR without murmur or gallop.  Respiratory:  Good breath sounds bilaterally, CTAB with normal respiratory effort Gastrointestinal: soft, flat abdomen, + epigastric ttp w/o rebound or guarding, normal active bowel sounds, no palpable masses, no hepatosplenomegaly, no appreciated hernias Skin:  Warm, no rashes     Commons side effects, risks, benefits, and alternatives for medications and treatment plan prescribed today were discussed, and the patient expressed understanding of the given instructions. Patient is instructed to call or message via MyChart if he/she has any questions or concerns regarding our treatment plan. No barriers to understanding were identified. We discussed Red Flag symptoms and signs in detail. Patient expressed understanding regarding what to do in case of urgent or emergency type symptoms.   Medication list was reconciled, printed and provided to the patient in AVS. Patient instructions and summary information was reviewed with the patient as documented in the AVS. This note was prepared with assistance of Dragon voice recognition software. Occasional wrong-word or sound-a-like substitutions may have occurred due to the inherent limitations of voice recognition software

## 2018-12-18 NOTE — Patient Instructions (Signed)
Please return in 4 weeks for recheck   Please hold the fosamax.  Start prilosec and remeron daily. Use tramadol for pain.   If you have any questions or concerns, please don't hesitate to send me a message via MyChart or call the office at [phone]. Thank you for visiting with [place] today! It's our pleasure caring for you.

## 2018-12-24 ENCOUNTER — Encounter: Payer: Self-pay | Admitting: Family Medicine

## 2019-01-06 ENCOUNTER — Telehealth: Payer: Self-pay

## 2019-01-06 ENCOUNTER — Other Ambulatory Visit: Payer: Self-pay

## 2019-01-06 ENCOUNTER — Encounter: Payer: Self-pay | Admitting: Family Medicine

## 2019-01-06 ENCOUNTER — Ambulatory Visit (INDEPENDENT_AMBULATORY_CARE_PROVIDER_SITE_OTHER): Payer: Medicare Other | Admitting: Family Medicine

## 2019-01-06 VITALS — BP 102/60 | HR 82 | Temp 98.3°F | Wt 82.0 lb

## 2019-01-06 DIAGNOSIS — R634 Abnormal weight loss: Secondary | ICD-10-CM | POA: Diagnosis not present

## 2019-01-06 DIAGNOSIS — F0391 Unspecified dementia with behavioral disturbance: Secondary | ICD-10-CM | POA: Diagnosis not present

## 2019-01-06 DIAGNOSIS — R630 Anorexia: Secondary | ICD-10-CM | POA: Diagnosis not present

## 2019-01-06 DIAGNOSIS — F321 Major depressive disorder, single episode, moderate: Secondary | ICD-10-CM | POA: Diagnosis not present

## 2019-01-06 DIAGNOSIS — R531 Weakness: Secondary | ICD-10-CM

## 2019-01-06 MED ORDER — SERTRALINE HCL 25 MG PO TABS: 25.0000 mg | ORAL_TABLET | Freq: Every day | ORAL | 0 refills | Status: DC

## 2019-01-06 NOTE — Telephone Encounter (Signed)
Please advise. No OV available with, ok to offer Sam?

## 2019-01-06 NOTE — Telephone Encounter (Signed)
Needs OV with someone today in [facility] system, sam or other.

## 2019-01-06 NOTE — Telephone Encounter (Signed)
Spoke with patient's daughter.  She states patient fell due to just "being weak."  No injury.  No complaint of dizziness or lightheadedness.  Patient was initially eating after being started on Remeron, but stopped eating a couple of days ago and has been "belching a lot".    Per daughter, no fever, nausea, vomiting, cough, or other COVID symptoms.  Daughter does not check patient's BP at home.    Would like patient to be seen today if possible.  Please advise.

## 2019-01-06 NOTE — Telephone Encounter (Signed)
Please call pt to schedule

## 2019-01-06 NOTE — Progress Notes (Signed)
   Subjective:    Patient ID: Jasmine Williams, female    DOB: January 12, 1941, 78 y.o.   MRN: [id number]  HPI Here with her daughter asking for help with generalized weakness and poor appetite. She normally sees Dr. Rogers Blocker. It seems she was doing well until her husband passed away last June 14, 2023. Since then her health has failed rapidly. She has a poor appetite and she has lost a lot of weight. She is weak and now she cannot walk without assistance. A head CT recently was normal. Labs revealed a very low thyroid level a few months ago, but after getting on Synthroid this has normalized. She has some thrombocytosis, and this is being worked up by Hematology. She was started on low dose Remeron to stimulate her appetite, and this helped for awhile, but now it seems to have little effect.    Review of Systems  Constitutional: Positive for appetite change, fatigue and unexpected weight change.  Respiratory: Negative.   Cardiovascular: Negative.   Gastrointestinal: Negative.   Genitourinary: Negative.        Objective:   Physical Exam Constitutional:      Comments: Frail, thin, in a wheelchair, profoundly hard of hearing   Cardiovascular:     Rate and Rhythm: Normal rate and regular rhythm.     Pulses: Normal pulses.     Heart sounds: Normal heart sounds.  Pulmonary:     Effort: Pulmonary effort is normal.     Breath sounds: Normal breath sounds.  Abdominal:     General: Abdomen is flat. Bowel sounds are normal. There is no distension.     Palpations: Abdomen is soft. There is no mass.     Tenderness: There is no abdominal tenderness. There is no guarding or rebound.     Hernia: No hernia is present.  Lymphadenopathy:     Cervical: No cervical adenopathy.  Neurological:     General: No focal deficit present.     Mental Status: She is oriented to person, place, and time.  Psychiatric:     Comments: She appears to be quite depressed, eye contact is poor            Assessment & Plan:   She seems to be very depressed, and this is not surprising given the recent loss of her spouse. I think this explains the lack of appetite. We will stop the Remeron and start her on Zoloft 25 mg daily. She will follow up with Dr. Rogers Blocker in 2-3 weeks, and I would anticipate increasing the dose of the Remeron to 50 mg daily. We spent 35 minutes together discussing this situation.  Alysia Penna, MD

## 2019-01-06 NOTE — Telephone Encounter (Signed)
I called and spoke with [name]'s daughter, Jasmine Williams, I told her our office was full but would give her a call back with an appt for another office today. I called and spoke with the office at [facility]. A nurse there informed me that she would contact Ms. Jasmine Williams's daughter to discuss her fall further and help the patient from there if an appt is needed, etc.

## 2019-01-12 ENCOUNTER — Inpatient Hospital Stay: Payer: Medicare Other | Attending: Oncology

## 2019-01-12 ENCOUNTER — Other Ambulatory Visit: Payer: Self-pay | Admitting: Oncology

## 2019-01-12 ENCOUNTER — Other Ambulatory Visit: Payer: Self-pay | Admitting: *Deleted

## 2019-01-12 ENCOUNTER — Other Ambulatory Visit: Payer: Self-pay

## 2019-01-12 ENCOUNTER — Encounter: Payer: Self-pay | Admitting: Oncology

## 2019-01-12 DIAGNOSIS — Z170 Estrogen receptor positive status [ER+]: Secondary | ICD-10-CM

## 2019-01-12 DIAGNOSIS — Z853 Personal history of malignant neoplasm of breast: Secondary | ICD-10-CM | POA: Insufficient documentation

## 2019-01-12 DIAGNOSIS — D473 Essential (hemorrhagic) thrombocythemia: Secondary | ICD-10-CM | POA: Insufficient documentation

## 2019-01-12 DIAGNOSIS — C50811 Malignant neoplasm of overlapping sites of right female breast: Secondary | ICD-10-CM

## 2019-01-12 DIAGNOSIS — F0391 Unspecified dementia with behavioral disturbance: Secondary | ICD-10-CM

## 2019-01-12 DIAGNOSIS — D75839 Thrombocytosis, unspecified: Secondary | ICD-10-CM

## 2019-01-12 DIAGNOSIS — D649 Anemia, unspecified: Secondary | ICD-10-CM

## 2019-01-12 LAB — CBC WITH DIFFERENTIAL/PLATELET
Abs Immature Granulocytes: 0.05 10*3/uL (ref 0.00–0.07)
Basophils Absolute: 0.1 10*3/uL (ref 0.0–0.1)
Basophils Relative: 1 %
Eosinophils Absolute: 0.1 10*3/uL (ref 0.0–0.5)
Eosinophils Relative: 1 %
HCT: 37.1 % (ref 36.0–46.0)
Hemoglobin: 11.7 g/dL — ABNORMAL LOW (ref 12.0–15.0)
Immature Granulocytes: 1 %
Lymphocytes Relative: 20 %
Lymphs Abs: 1.7 10*3/uL (ref 0.7–4.0)
MCH: 26.4 pg (ref 26.0–34.0)
MCHC: 31.5 g/dL (ref 30.0–36.0)
MCV: 83.7 fL (ref 80.0–100.0)
Monocytes Absolute: 0.9 10*3/uL (ref 0.1–1.0)
Monocytes Relative: 11 %
Neutro Abs: 5.6 10*3/uL (ref 1.7–7.7)
Neutrophils Relative %: 66 %
Platelets: 566 10*3/uL — ABNORMAL HIGH (ref 150–400)
RBC: 4.43 MIL/uL (ref 3.87–5.11)
RDW: 14.4 % (ref 11.5–15.5)
WBC: 8.4 10*3/uL (ref 4.0–10.5)
nRBC: 0 % (ref 0.0–0.2)

## 2019-01-18 ENCOUNTER — Encounter: Payer: Self-pay | Admitting: Oncology

## 2019-01-20 ENCOUNTER — Other Ambulatory Visit: Payer: Self-pay

## 2019-01-20 ENCOUNTER — Ambulatory Visit (INDEPENDENT_AMBULATORY_CARE_PROVIDER_SITE_OTHER): Payer: Medicare Other | Admitting: Family Medicine

## 2019-01-20 ENCOUNTER — Encounter: Payer: Self-pay | Admitting: Family Medicine

## 2019-01-20 VITALS — BP 100/52 | HR 97 | Temp 98.1°F | Resp 14 | Ht 60.0 in | Wt 80.8 lb

## 2019-01-20 DIAGNOSIS — F0391 Unspecified dementia with behavioral disturbance: Secondary | ICD-10-CM | POA: Diagnosis not present

## 2019-01-20 DIAGNOSIS — F321 Major depressive disorder, single episode, moderate: Secondary | ICD-10-CM

## 2019-01-20 DIAGNOSIS — R630 Anorexia: Secondary | ICD-10-CM

## 2019-01-20 DIAGNOSIS — R531 Weakness: Secondary | ICD-10-CM | POA: Diagnosis not present

## 2019-01-20 MED ORDER — SERTRALINE HCL 50 MG PO TABS: 50.0000 mg | ORAL_TABLET | Freq: Every day | ORAL | 1 refills | Status: AC

## 2019-01-20 NOTE — Patient Instructions (Signed)
Please return in 6-8 weeks with Dr. Rogers Blocker to f/u on depression and medication change.  Please see if you can get in to see the neurologist for memory evaluation.   If you have any questions or concerns, please don't hesitate to send me a message via MyChart or call the office at [phone]. Thank you for visiting with Jasmine Williams today! It's our pleasure caring for you.

## 2019-01-20 NOTE — Progress Notes (Signed)
Subjective  CC:  Chief Complaint  Patient presents with  . Fall    Increased falls, last fall was last night. She doe snow have a black eye due to fall  . Depression    Remeron stopped by Dr. Sarajane Jews on 7/1 and start her on Zoloft 25 mg daily  . Weight Loss    She is now weighing 80.8 lbs    HPI: Jasmine Williams is a 78 y.o. female who presents to the office today to address the problems listed above in the chief complaint.  Reviewed recent not by Dr. Sharlene Motts due to worsening mood, weight loss, loss of appetite: dxd with depression and switched from remeron to zoloft:  Daughter says it is now starting to help. Pt says she is "fine". Doesn't feel hungry and doesn't want to eat. Was anhedonic and hypokinetic and staying in bed all day. Now up and dressed more. No AE from zoloft. Has been weak and falling. No LOC.   Very antagonistic. Daughter reports poor memory and will argue over things that are not true. Never got appt with neurology. Apparently the pt declined it.   Assessment  1. Depression, major, single episode, moderate ([place])   2. Loss of appetite   3. Weakness generalized   4. Dementia with behavioral disturbance, unspecified dementia type ([place])      Plan   Depression but also likely dementia:  Increase zoloft to 50mg  daily. Recommend neuro eval to help clarify and treat. Pt and family need dx, tx and then supportive services. At this time, pt is refusing most options.   Work on increasing calories: problem solved with family. Labs do not reveal internal disorder.   Follow up: Return in about 8 weeks (around 03/17/2019) for mood follow up, recheck.  Visit date not found  No orders of the defined types were placed in this encounter.  Meds ordered this encounter  Medications  . sertraline (ZOLOFT) 50 MG tablet    Sig: Take 1 tablet (50 mg total) by mouth at bedtime.    Dispense:  90 tablet    Refill:  1      I reviewed the patients updated PMH, FH, and SocHx.     Patient Active Problem List   Diagnosis Date Noted  . Depression, major, single episode, moderate ([place]) 12/18/2018  . Malignant neoplasm of overlapping sites of right breast in female, estrogen receptor positive ([place]) 12/14/2018  . Anemia 12/14/2018  . Dementia ([place]) 12/14/2018  . Thrombocytosis ([place]) 12/07/2018  . History of breast cancer 08/23/2013  . Postmenopausal estrogen deficiency 08/23/2013  . Osteopenia 08/23/2013  . Unspecified vitamin D deficiency 11/06/2012   Current Meds  Medication Sig  . acetaminophen (TYLENOL) 500 MG tablet Take 1,000 mg by mouth every 6 (six) hours as needed.  Marland Kitchen levothyroxine (SYNTHROID) 25 MCG tablet Take 1 tablet (25 mcg total) by mouth daily before breakfast. Labs in 6-8 weeks  . omeprazole (PRILOSEC OTC) 20 MG tablet Take 1 tablet (20 mg total) by mouth daily.  . sertraline (ZOLOFT) 50 MG tablet Take 1 tablet (50 mg total) by mouth at bedtime.  . Vitamin D, Ergocalciferol, (DRISDOL) 1.25 MG (50000 UT) CAPS capsule 1 TAB EVERY OTHER WEEK  . [DISCONTINUED] sertraline (ZOLOFT) 25 MG tablet Take 1 tablet (25 mg total) by mouth at bedtime.    Allergies: Patient has No Known Allergies. Family History: Patient family history includes Cancer in her sister. Social History:  Patient  reports that she quit smoking  about 47 years ago. She has never used smokeless tobacco. She reports current alcohol use. She reports that she does not use drugs.  Review of Systems: Constitutional: Negative for fever malaise or anorexia Cardiovascular: negative for chest pain Respiratory: negative for SOB or persistent cough Gastrointestinal: negative for abdominal pain  Wt Readings from Last 3 Encounters:  01/20/19 80 lb 12.8 oz (36.7 kg)  01/06/19 82 lb (37.2 kg)  12/18/18 86 lb 12.8 oz (39.4 kg)    Objective  Vitals: BP (!) 100/52   Pulse 97   Temp 98.1 F (36.7 C) (Oral)   Resp 14   Ht 5' (1.524 m)   Wt 80 lb 12.8 oz (36.7 kg)   LMP  (LMP Unknown)   SpO2  99%   BMI 15.78 kg/m  General: no acute distress , A&O but argumentative, can't really answer questions Psych: flat affective, frustrated HEENT: [place]ER[place], conjunctiva normal, bruising around right eye present, Oropharynx moist,neck is supple    Commons side effects, risks, benefits, and alternatives for medications and treatment plan prescribed today were discussed, and the patient expressed understanding of the given instructions. Patient is instructed to call or message via MyChart if he/she has any questions or concerns regarding our treatment plan. No barriers to understanding were identified. We discussed Red Flag symptoms and signs in detail. Patient expressed understanding regarding what to do in case of urgent or emergency type symptoms.   Medication list was reconciled, printed and provided to the patient in AVS. Patient instructions and summary information was reviewed with the patient as documented in the AVS. This note was prepared with assistance of Dragon voice recognition software. Occasional wrong-word or sound-a-like substitutions may have occurred due to the inherent limitations of voice recognition software

## 2019-01-23 ENCOUNTER — Encounter (HOSPITAL_COMMUNITY): Payer: Self-pay | Admitting: Emergency Medicine

## 2019-01-23 ENCOUNTER — Emergency Department (HOSPITAL_COMMUNITY): Payer: Medicare Other

## 2019-01-23 ENCOUNTER — Inpatient Hospital Stay (HOSPITAL_COMMUNITY)
Admission: EM | Admit: 2019-01-23 | Discharge: 2019-01-25 | DRG: 865 | Disposition: A | Discharge status: Home / Self Care | Payer: Medicare Other | Attending: Student | Admitting: Student

## 2019-01-23 ENCOUNTER — Other Ambulatory Visit: Payer: Self-pay

## 2019-01-23 DIAGNOSIS — K219 Gastro-esophageal reflux disease without esophagitis: Secondary | ICD-10-CM | POA: Diagnosis present

## 2019-01-23 DIAGNOSIS — R112 Nausea with vomiting, unspecified: Secondary | ICD-10-CM

## 2019-01-23 DIAGNOSIS — E872 Acidosis, unspecified: Secondary | ICD-10-CM | POA: Diagnosis present

## 2019-01-23 DIAGNOSIS — Z1501 Genetic susceptibility to malignant neoplasm of breast: Secondary | ICD-10-CM

## 2019-01-23 DIAGNOSIS — E876 Hypokalemia: Secondary | ICD-10-CM

## 2019-01-23 DIAGNOSIS — R7989 Other specified abnormal findings of blood chemistry: Secondary | ICD-10-CM | POA: Diagnosis not present

## 2019-01-23 DIAGNOSIS — E039 Hypothyroidism, unspecified: Secondary | ICD-10-CM | POA: Diagnosis present

## 2019-01-23 DIAGNOSIS — Z803 Family history of malignant neoplasm of breast: Secondary | ICD-10-CM

## 2019-01-23 DIAGNOSIS — Z681 Body mass index (BMI) 19 or less, adult: Secondary | ICD-10-CM

## 2019-01-23 DIAGNOSIS — A419 Sepsis, unspecified organism: Secondary | ICD-10-CM | POA: Diagnosis present

## 2019-01-23 DIAGNOSIS — R778 Other specified abnormalities of plasma proteins: Secondary | ICD-10-CM | POA: Diagnosis present

## 2019-01-23 DIAGNOSIS — Z79890 Hormone replacement therapy (postmenopausal): Secondary | ICD-10-CM

## 2019-01-23 DIAGNOSIS — D509 Iron deficiency anemia, unspecified: Secondary | ICD-10-CM | POA: Diagnosis present

## 2019-01-23 DIAGNOSIS — R6510 Systemic inflammatory response syndrome (SIRS) of non-infectious origin without acute organ dysfunction: Secondary | ICD-10-CM | POA: Diagnosis present

## 2019-01-23 DIAGNOSIS — E43 Unspecified severe protein-calorie malnutrition: Secondary | ICD-10-CM | POA: Diagnosis present

## 2019-01-23 DIAGNOSIS — F0390 Unspecified dementia without behavioral disturbance: Secondary | ICD-10-CM | POA: Diagnosis present

## 2019-01-23 DIAGNOSIS — F329 Major depressive disorder, single episode, unspecified: Secondary | ICD-10-CM | POA: Diagnosis present

## 2019-01-23 DIAGNOSIS — S0011XA Contusion of right eyelid and periocular area, initial encounter: Secondary | ICD-10-CM | POA: Diagnosis present

## 2019-01-23 DIAGNOSIS — Z853 Personal history of malignant neoplasm of breast: Secondary | ICD-10-CM

## 2019-01-23 DIAGNOSIS — B349 Viral infection, unspecified: Secondary | ICD-10-CM | POA: Diagnosis not present

## 2019-01-23 DIAGNOSIS — N9489 Other specified conditions associated with female genital organs and menstrual cycle: Secondary | ICD-10-CM

## 2019-01-23 DIAGNOSIS — Z87440 Personal history of urinary (tract) infections: Secondary | ICD-10-CM

## 2019-01-23 DIAGNOSIS — Z79899 Other long term (current) drug therapy: Secondary | ICD-10-CM

## 2019-01-23 DIAGNOSIS — D649 Anemia, unspecified: Secondary | ICD-10-CM | POA: Diagnosis present

## 2019-01-23 DIAGNOSIS — Z87891 Personal history of nicotine dependence: Secondary | ICD-10-CM

## 2019-01-23 DIAGNOSIS — R9431 Abnormal electrocardiogram [ECG] [EKG]: Secondary | ICD-10-CM | POA: Diagnosis present

## 2019-01-23 DIAGNOSIS — N179 Acute kidney failure, unspecified: Secondary | ICD-10-CM | POA: Diagnosis not present

## 2019-01-23 DIAGNOSIS — J449 Chronic obstructive pulmonary disease, unspecified: Secondary | ICD-10-CM | POA: Diagnosis present

## 2019-01-23 DIAGNOSIS — E860 Dehydration: Secondary | ICD-10-CM | POA: Diagnosis present

## 2019-01-23 DIAGNOSIS — R000 Tachycardia, unspecified: Secondary | ICD-10-CM | POA: Diagnosis present

## 2019-01-23 DIAGNOSIS — Z923 Personal history of irradiation: Secondary | ICD-10-CM

## 2019-01-23 DIAGNOSIS — Z1159 Encounter for screening for other viral diseases: Secondary | ICD-10-CM

## 2019-01-23 DIAGNOSIS — R739 Hyperglycemia, unspecified: Secondary | ICD-10-CM | POA: Diagnosis present

## 2019-01-23 DIAGNOSIS — Z8619 Personal history of other infectious and parasitic diseases: Secondary | ICD-10-CM

## 2019-01-23 DIAGNOSIS — W1800XA Striking against unspecified object with subsequent fall, initial encounter: Secondary | ICD-10-CM | POA: Diagnosis present

## 2019-01-23 HISTORY — DX: Gastro-esophageal reflux disease without esophagitis: K21.9

## 2019-01-23 HISTORY — DX: Depression, unspecified: F32.A

## 2019-01-23 HISTORY — DX: Hypothyroidism, unspecified: E03.9

## 2019-01-23 LAB — URINALYSIS, ROUTINE W REFLEX MICROSCOPIC
Bacteria, UA: NONE SEEN
Bilirubin Urine: NEGATIVE
Glucose, UA: NEGATIVE mg/dL
Hgb urine dipstick: NEGATIVE
Ketones, ur: 20 mg/dL — AB
Leukocytes,Ua: NEGATIVE
Nitrite: NEGATIVE
Protein, ur: 30 mg/dL — AB
Specific Gravity, Urine: 1.016 (ref 1.005–1.030)
pH: 8 (ref 5.0–8.0)

## 2019-01-23 LAB — CBC WITH DIFFERENTIAL/PLATELET
Abs Immature Granulocytes: 0.08 10*3/uL — ABNORMAL HIGH (ref 0.00–0.07)
Basophils Absolute: 0.1 10*3/uL (ref 0.0–0.1)
Basophils Relative: 0 %
Eosinophils Absolute: 0 10*3/uL (ref 0.0–0.5)
Eosinophils Relative: 0 %
HCT: 41.4 % (ref 36.0–46.0)
Hemoglobin: 12.6 g/dL (ref 12.0–15.0)
Immature Granulocytes: 1 %
Lymphocytes Relative: 8 %
Lymphs Abs: 1 10*3/uL (ref 0.7–4.0)
MCH: 25.8 pg — ABNORMAL LOW (ref 26.0–34.0)
MCHC: 30.4 g/dL (ref 30.0–36.0)
MCV: 84.7 fL (ref 80.0–100.0)
Monocytes Absolute: 0.9 10*3/uL (ref 0.1–1.0)
Monocytes Relative: 7 %
Neutro Abs: 10.7 10*3/uL — ABNORMAL HIGH (ref 1.7–7.7)
Neutrophils Relative %: 84 %
Platelets: 704 10*3/uL — ABNORMAL HIGH (ref 150–400)
RBC: 4.89 MIL/uL (ref 3.87–5.11)
RDW: 14.4 % (ref 11.5–15.5)
WBC: 12.8 10*3/uL — ABNORMAL HIGH (ref 4.0–10.5)
nRBC: 0 % (ref 0.0–0.2)

## 2019-01-23 LAB — TROPONIN I (HIGH SENSITIVITY)
Troponin I (High Sensitivity): 34 ng/L — ABNORMAL HIGH (ref <18)
Troponin I (High Sensitivity): 42 ng/L — ABNORMAL HIGH (ref <18)

## 2019-01-23 LAB — COMPREHENSIVE METABOLIC PANEL
ALT: 21 U/L (ref 0–44)
AST: 31 U/L (ref 15–41)
Albumin: 3.9 g/dL (ref 3.5–5.0)
Alkaline Phosphatase: 109 U/L (ref 38–126)
Anion gap: 30 — ABNORMAL HIGH (ref 5–15)
BUN: 28 mg/dL — ABNORMAL HIGH (ref 8–23)
CO2: 31 mmol/L (ref 22–32)
Calcium: 9.7 mg/dL (ref 8.9–10.3)
Chloride: 80 mmol/L — ABNORMAL LOW (ref 98–111)
Creatinine, Ser: 1.88 mg/dL — ABNORMAL HIGH (ref 0.44–1.00)
GFR calc Af Amer: 29 mL/min — ABNORMAL LOW (ref >60)
GFR calc non Af Amer: 25 mL/min — ABNORMAL LOW (ref >60)
Glucose, Bld: 197 mg/dL — ABNORMAL HIGH (ref 70–99)
Potassium: 2.7 mmol/L — CL (ref 3.5–5.1)
Sodium: 141 mmol/L (ref 135–145)
Total Bilirubin: 1.3 mg/dL — ABNORMAL HIGH (ref 0.3–1.2)
Total Protein: 7.6 g/dL (ref 6.5–8.1)

## 2019-01-23 LAB — LACTIC ACID, PLASMA
Lactic Acid, Venous: 5.1 mmol/L (ref 0.5–1.9)
Lactic Acid, Venous: 2.9 mmol/L (ref 0.5–1.9)

## 2019-01-23 LAB — MAGNESIUM: Magnesium: 2.2 mg/dL (ref 1.7–2.4)

## 2019-01-23 LAB — D-DIMER, QUANTITATIVE: D-Dimer, Quant: 1.07 ug/mL-FEU — ABNORMAL HIGH (ref 0.00–0.50)

## 2019-01-23 LAB — SARS CORONAVIRUS 2 BY RT PCR (HOSPITAL ORDER, PERFORMED IN ~~LOC~~ HOSPITAL LAB): SARS Coronavirus 2: NEGATIVE

## 2019-01-23 MED ORDER — VANCOMYCIN HCL IN DEXTROSE 1-5 GM/200ML-% IV SOLN: 1000.0000 mg | Freq: Once | INTRAVENOUS | Status: DC

## 2019-01-23 MED ORDER — ONDANSETRON HCL 4 MG/2ML IJ SOLN: 4.0000 mg | Freq: Four times a day (QID) | INTRAMUSCULAR | Status: DC | PRN

## 2019-01-23 MED ORDER — ACETAMINOPHEN 325 MG PO TABS
650.0000 mg | ORAL_TABLET | Freq: Four times a day (QID) | ORAL | Status: DC | PRN
  Administered 2019-01-25: 650 mg via ORAL
  Filled 2019-01-23: qty 2

## 2019-01-23 MED ORDER — METRONIDAZOLE IN NACL 5-0.79 MG/ML-% IV SOLN
500.0000 mg | Freq: Once | INTRAVENOUS | Status: AC
  Administered 2019-01-23: 500 mg via INTRAVENOUS
  Filled 2019-01-23: qty 100

## 2019-01-23 MED ORDER — ASPIRIN EC 325 MG PO TBEC
325.0000 mg | DELAYED_RELEASE_TABLET | Freq: Every day | ORAL | Status: DC
  Administered 2019-01-24 (×2): 325 mg via ORAL
  Filled 2019-01-23 (×3): qty 1

## 2019-01-23 MED ORDER — SODIUM CHLORIDE 0.9 % IV SOLN
1.0000 g | Freq: Once | INTRAVENOUS | Status: AC
  Administered 2019-01-23: 1 g via INTRAVENOUS
  Filled 2019-01-23: qty 1

## 2019-01-23 MED ORDER — POTASSIUM CHLORIDE 10 MEQ/100ML IV SOLN: 10.0000 meq | INTRAVENOUS | Status: AC

## 2019-01-23 MED ORDER — SODIUM CHLORIDE 0.9 % IV BOLUS
500.0000 mL | Freq: Once | INTRAVENOUS | Status: AC
  Administered 2019-01-23: 19:00:00 500 mL via INTRAVENOUS

## 2019-01-23 MED ORDER — POTASSIUM CHLORIDE IN NACL 20-0.9 MEQ/L-% IV SOLN
INTRAVENOUS | Status: AC
  Administered 2019-01-24: 01:00:00 via INTRAVENOUS
  Filled 2019-01-23 (×2): qty 1000

## 2019-01-23 MED ORDER — SODIUM CHLORIDE 0.9 % IV BOLUS
500.0000 mL | Freq: Once | INTRAVENOUS | Status: AC
  Administered 2019-01-23: 21:00:00 500 mL via INTRAVENOUS

## 2019-01-23 MED ORDER — FAMOTIDINE IN NACL 20-0.9 MG/50ML-% IV SOLN
20.0000 mg | Freq: Once | INTRAVENOUS | Status: AC
  Administered 2019-01-23: 20 mg via INTRAVENOUS
  Filled 2019-01-23: qty 50

## 2019-01-23 MED ORDER — SODIUM CHLORIDE 0.9 % IV SOLN: 2.0000 g | Freq: Once | INTRAVENOUS | Status: DC

## 2019-01-23 MED ORDER — VANCOMYCIN HCL IN DEXTROSE 750-5 MG/150ML-% IV SOLN
750.0000 mg | Freq: Once | INTRAVENOUS | Status: AC
  Administered 2019-01-23: 750 mg via INTRAVENOUS
  Filled 2019-01-23: qty 150

## 2019-01-23 MED ORDER — POTASSIUM CHLORIDE 10 MEQ/100ML IV SOLN
10.0000 meq | INTRAVENOUS | Status: AC
  Administered 2019-01-23 (×2): 10 meq via INTRAVENOUS
  Filled 2019-01-23 (×2): qty 100

## 2019-01-23 MED ORDER — ACETAMINOPHEN 650 MG RE SUPP: 650.0000 mg | Freq: Four times a day (QID) | RECTAL | Status: DC | PRN

## 2019-01-23 NOTE — ED Notes (Signed)
Date and time results received: 01/23/19 5:28 PM (use smartphrase ".now" to insert current time)  Test: potassium  Critical Value: 2.7  Name of Provider Notified: Frederico Hamman RN   Orders Received? Or Actions Taken?:

## 2019-01-23 NOTE — ED Notes (Addendum)
Patient vomiting. MD made aware. Ice chips taken from patient.

## 2019-01-23 NOTE — ED Notes (Signed)
Attempted to call report, RN unable to come to phone, will try back in a few minutes.

## 2019-01-23 NOTE — ED Notes (Signed)
Received V/O for repeat EKG from EDP [facility] EKG completed and given to [place] for review

## 2019-01-23 NOTE — Progress Notes (Addendum)
A consult was received from an ED physician for Vancomycin & Cefepime per pharmacy dosing.  The patient's profile has been reviewed for ht/wt/allergies/indication/available labs.   A one time order has been placed for Vancomycin 750mg  IV x1 & Cefepime 1gm IV x1.  Further antibiotics/pharmacy consults should be ordered by admitting physician if indicated.                       Thank you, Biagio Borg 01/23/2019  8:09 PM

## 2019-01-23 NOTE — ED Notes (Signed)
Patient transported to X-ray 

## 2019-01-23 NOTE — ED Triage Notes (Signed)
Per EMS from home family reports emesis x2 today started today pt c/o Head congestion and ear pain. A/O x4   cbg: 306  Bp: 136/88 HR:110 96% on RA Temp: 97.9 oral

## 2019-01-23 NOTE — H&P (Addendum)
TRH H&P    Patient Demographics:    Jasmine Williams, is a 78 y.o. female  MRN: [id number]  DOB - 05/22/1941  Admit Date - 01/23/2019  Referring MD/NP/PA:   Pamala Duffel  Outpatient Primary MD for the patient is Orma Flaming, MD Lurline Del - oncologist Thea Silversmith - radiation oncology Rolm Bookbinder - surgery  Patient coming from:  home  Chief complaint-   Nausea and vommitting   HPI:    Jasmine Williams  is a 78 y.o. female,  w hx of ccy, hx of breast cancer, invasive ductal carcinoma ER/PR+, Her2 +, w associated DCIS s/p R lumpectomy 08/05/2011 s/p XRT, most recently a new diagnosis of thrombocytosis (985)  of unclear etiology 11/25/18  C/o nausea and vomitting for the past 2 days.   Pt denies fever, chills, cough, cp, palp, sob, abd pain, diarrhea, brbpr, black stool, dysuria, hematuria.    Pt notes that her zoloft was increased on 01/20/2019.  Pt was previously on remeron for depression and switched on 01/06/2019 by her pcp.    In ED,  T 96.5  P 118 R 20 Bp 118/94  Pox 100% on RA   CT brain IMPRESSION: No acute findings. Mild chronic ischemic microvascular disease.  CXR IMPRESSION: No acute abnormality. Mild changes of COPD.  Na 141, K 2.7,  Bun 28, Creatinine 1.88 Glucose 197 Ast 31, Alt 21, Alk phos 109, T. Bili 1.3 Urinalysis wbc 0-5, rbc 0-5 Wbc 12.8, hgb 12.6, Plt 704 Lactic acid 5.1 -> 2.9  Trop I 34 -> 42  covid -19 negative  Pt received Ns iv while in ED, as well as started on potassium repletion.  Pt also given iv abx (vanco, cefepime, flagyl), as well as pepcid iv in ED.   Pt will be admitted for intractable nausea and vomitting as well as severe hypokalemia, acute renal failure and dehydration and elevated troponin.     Review of systems:    In addition to the HPI above,  No Fever-chills, No Headache, No changes with Vision or hearing, No problems swallowing  food or Liquids, No Chest pain, No Cough or Shortness of Breath, No Abdominal pain, bowel movements are regular, No Blood in stool or Urine, No dysuria, No new skin rashes or bruises, No new joints pains-aches,  No new weakness, tingling, numbness in any extremity, No recent weight gain or loss, No polyuria, polydypsia or polyphagia, No significant Mental Stressors.  All other systems reviewed and are negative.    Past History of the following :    Past Medical History:  Diagnosis Date   Cancer ([place])    right breast   Depression    GERD (gastroesophageal reflux disease)    History of chicken pox    History of recurrent UTIs    Hypothyroidism    No pertinent past medical history       Past Surgical History:  Procedure Laterality Date   BREAST LUMPECTOMY     right lump, snbx   broken leg     right as  a child   CHOLECYSTECTOMY        Social History:      Social History   Tobacco Use   Smoking status: Former Smoker    Quit date: 07/25/1971    Years since quitting: 47.5   Smokeless tobacco: Never Used  Substance Use Topics   Alcohol use: Yes    Comment: rare       Family History :     Family History  Problem Relation Age of Onset   Cancer Sister        breast       Home Medications:   Prior to Admission medications   Medication Sig Start Date End Date Taking? Authorizing Provider  levothyroxine (SYNTHROID) 25 MCG tablet Take 1 tablet (25 mcg total) by mouth daily before breakfast. Labs in 6-8 weeks 11/27/18  Yes Orma Flaming, MD  omeprazole (PRILOSEC OTC) 20 MG tablet Take 1 tablet (20 mg total) by mouth daily. 12/18/18  Yes Leamon Arnt, MD  sertraline (ZOLOFT) 50 MG tablet Take 1 tablet (50 mg total) by mouth at bedtime. 01/20/19  Yes Leamon Arnt, MD     Allergies:    No Known Allergies   Physical Exam:   Vitals  Blood pressure (!) 147/73, pulse (!) 113, temperature (!) 96.5 F (35.8 C), temperature source Oral,  resp. rate 14, SpO2 100 %.  1.  General: Aoxo3,   2. Psychiatric: euthymic  3. Neurologic: cn2-12 intact, reflexes 2+ symmetric, diffuse with no clonus, motor 5/5 in all 4 ext  4. HEENMT:  Anicteric, pupils 1.50m symmetric, direct, consensual, near intact Mucous membranes bone dry Neck: no jvd, no bruit,   5. Respiratory : CTAB  6. Cardiovascular : rrr s1, s2, no m/g/r  7. Gastrointestinal:  Abd: soft, nt, nd, +bs  8. Skin:  Ext: no c/c/e,  No rash  9.Musculoskeletal:  Good ROM,  No adenopathy    Data Review:    CBC Recent Labs  Lab 01/23/19 1642  WBC 12.8*  HGB 12.6  HCT 41.4  PLT 704*  MCV 84.7  MCH 25.8*  MCHC 30.4  RDW 14.4  LYMPHSABS 1.0  MONOABS 0.9  EOSABS 0.0  BASOSABS 0.1   ------------------------------------------------------------------------------------------------------------------  Results for orders placed or performed during the hospital encounter of 01/23/19 (from the past 48 hour(s))  Lactic acid, plasma     Status: Abnormal   Collection Time: 01/23/19  3:49 PM  Result Value Ref Range   Lactic Acid, Venous 5.1 (HH) 0.5 - 1.9 mmol/L    Comment: CRITICAL RESULT CALLED TO, READ BACK BY AND VERIFIED WITH:Hollie [id number]@ 1[place]Performed at [place] [place] [address]  Comprehensive metabolic panel     Status: Abnormal   Collection Time: 01/23/19  4:42 PM  Result Value Ref Range   Sodium 141 135 - 145 mmol/L   Potassium 2.7 (LL) 3.5 - 5.1 mmol/L    Comment: CRITICAL RESULT CALLED TO, READ BACK BY AND VERIFIED WITH: CARY HALL,RN [id number]@ 1728 BY J SCOTTON    Chloride 80 (L) 98 - 111 mmol/L   CO2 31 22 - 32 mmol/L   Glucose, Bld 197 (H) 70 - 99 mg/dL   BUN 28 (H) 8 - 23 mg/dL   Creatinine, Ser 1.88 (H) 0.44 - 1.00 mg/dL   Calcium 9.7 8.9 - 10.3 mg/dL   Total Protein 7.6 6.5 - 8.1 g/dL   Albumin 3.9  3.5 - 5.0 g/dL   AST 31 15 - 41 U/L   ALT 21 0 - 44 U/L    Alkaline Phosphatase 109 38 - 126 U/L   Total Bilirubin 1.3 (H) 0.3 - 1.2 mg/dL   GFR calc non Af Amer 25 (L) >60 mL/min   GFR calc Af Amer 29 (L) >60 mL/min   Anion gap 30 (H) 5 - 15    Comment: RESULT CHECKED Performed at [facility] [address], [place], [place] [address]   CBC with Differential     Status: Abnormal   Collection Time: 01/23/19  4:42 PM  Result Value Ref Range   WBC 12.8 (H) 4.0 - 10.5 K/uL   RBC 4.89 3.87 - 5.11 MIL/uL   Hemoglobin 12.6 12.0 - 15.0 g/dL   HCT 41.4 36.0 - 46.0 %   MCV 84.7 80.0 - 100.0 fL   MCH 25.8 (L) 26.0 - 34.0 pg   MCHC 30.4 30.0 - 36.0 g/dL   RDW 14.4 11.5 - 15.5 %   Platelets 704 (H) 150 - 400 K/uL   nRBC 0.0 0.0 - 0.2 %   Neutrophils Relative % 84 %   Neutro Abs 10.7 (H) 1.7 - 7.7 K/uL   Lymphocytes Relative 8 %   Lymphs Abs 1.0 0.7 - 4.0 K/uL   Monocytes Relative 7 %   Monocytes Absolute 0.9 0.1 - 1.0 K/uL   Eosinophils Relative 0 %   Eosinophils Absolute 0.0 0.0 - 0.5 K/uL   Basophils Relative 0 %   Basophils Absolute 0.1 0.0 - 0.1 K/uL   Immature Granulocytes 1 %   Abs Immature Granulocytes 0.08 (H) 0.00 - 0.07 K/uL    Comment: Performed at [facility], [place] [address] [place], [place] [address]  Urinalysis, Routine w reflex microscopic     Status: Abnormal   Collection Time: 01/23/19  4:42 PM  Result Value Ref Range   Color, Urine YELLOW YELLOW   APPearance HAZY (A) CLEAR   Specific Gravity, Urine 1.016 1.005 - 1.030   pH 8.0 5.0 - 8.0   Glucose, UA NEGATIVE NEGATIVE mg/dL   Hgb urine dipstick NEGATIVE NEGATIVE   Bilirubin Urine NEGATIVE NEGATIVE   Ketones, ur 20 (A) NEGATIVE mg/dL   Protein, ur 30 (A) NEGATIVE mg/dL   Nitrite NEGATIVE NEGATIVE   Leukocytes,Ua NEGATIVE NEGATIVE   RBC / HPF 0-5 0 - 5 RBC/hpf   WBC, UA 0-5 0 - 5 WBC/hpf   Bacteria, UA NONE SEEN NONE SEEN   Squamous Epithelial / LPF 0-5 0 - 5   Mucus PRESENT    Hyaline Casts, UA PRESENT     Comment: Performed  at [facility], [place] [address], [place], [place] [address]  SARS Coronavirus 2 (CEPHEID - Performed in [place] hospital lab), Hosp Order     Status: None   Collection Time: 01/23/19  4:42 PM   Specimen: Nasopharyngeal Swab  Result Value Ref Range   SARS Coronavirus 2 NEGATIVE NEGATIVE    Comment: (NOTE) If result is NEGATIVE SARS-CoV-2 target nucleic acids are NOT DETECTED. The SARS-CoV-2 RNA is generally detectable in upper and lower  respiratory specimens during the acute phase of infection. The lowest  concentration of SARS-CoV-2 viral copies this assay can detect is 250  copies / mL. A negative result does not preclude SARS-CoV-2 infection  and should not be used as the sole basis for treatment or other  patient management decisions.  A negative  result may occur with  improper specimen collection / handling, submission of specimen other  than nasopharyngeal swab, presence of viral mutation(s) within the  areas targeted by this assay, and inadequate number of viral copies  (<250 copies / mL). A negative result must be combined with clinical  observations, patient history, and epidemiological information. If result is POSITIVE SARS-CoV-2 target nucleic acids are DETECTED. The SARS-CoV-2 RNA is generally detectable in upper and lower  respiratory specimens dur ing the acute phase of infection.  Positive  results are indicative of active infection with SARS-CoV-2.  Clinical  correlation with patient history and other diagnostic information is  necessary to determine patient infection status.  Positive results do  not rule out bacterial infection or co-infection with other viruses. If result is PRESUMPTIVE POSTIVE SARS-CoV-2 nucleic acids MAY BE PRESENT.   A presumptive positive result was obtained on the submitted specimen  and confirmed on repeat testing.  While 2019 novel coronavirus  (SARS-CoV-2) nucleic acids may be present in the submitted sample    additional confirmatory testing may be necessary for epidemiological  and / or clinical management purposes  to differentiate between  SARS-CoV-2 and other Sarbecovirus currently known to infect humans.  If clinically indicated additional testing with an alternate test  methodology [phone]) is advised. The SARS-CoV-2 RNA is generally  detectable in upper and lower respiratory sp ecimens during the acute  phase of infection. The expected result is Negative. Fact Sheet for Patients:  StrictlyIdeas.no Fact Sheet for Healthcare Providers: BankingDealers.co.za This test is not yet approved or cleared by the [place] FDA and has been authorized for detection and/or diagnosis of SARS-CoV-2 by FDA under an Emergency Use Authorization (EUA).  This EUA will remain in effect (meaning this test can be used) for the duration of the COVID-19 declaration under Section 564(b)(1) of the Act, 21 U.S.C. section 360bbb-3(b)(1), unless the authorization is terminated or revoked sooner. Performed at [facility], [place] [address], [place], [place] [address]   Troponin I (High Sensitivity)     Status: Abnormal   Collection Time: 01/23/19  5:20 PM  Result Value Ref Range   Troponin I (High Sensitivity) 34.0 (H) <18 ng/L    Comment: (NOTE) Elevated high sensitivity troponin I (hsTnI) values and significant  changes across serial measurements may suggest ACS but many other  chronic and acute conditions are known to elevate hsTnI results.  Refer to the "Links" section for chest pain algorithms and additional  guidance. Performed at [facility], [place] [address] [place], [place] [address]   Lactic acid, plasma     Status: Abnormal   Collection Time: 01/23/19  9:01 PM  Result Value Ref Range   Lactic Acid, Venous 2.9 (HH) 0.5 - 1.9 mmol/L    Comment: CRITICAL RESULT CALLED TO, READ BACK BY AND VERIFIED WITH: C,  RAZILLE AT 2202 ON 01/23/19 BY A,MOHAMED Performed at [facility], [place] [address] [place], [place] [address]   Troponin I (High Sensitivity)     Status: Abnormal   Collection Time: 01/23/19  9:01 PM  Result Value Ref Range   Troponin I (High Sensitivity) 42.0 (H) <18 ng/L    Comment: (NOTE) Elevated high sensitivity troponin I (hsTnI) values and significant  changes across serial measurements may suggest ACS but many other  chronic and acute conditions are known to elevate hsTnI results.  Refer to the "Links" section for chest pain algorithms and additional  guidance. Performed at [facility]  [place] [address] [place], [place] [address]   Magnesium     Status: None   Collection Time: 01/23/19  9:01 PM  Result Value Ref Range   Magnesium 2.2 1.7 - 2.4 mg/dL    Comment: Performed at [facility], [place] [address], [place], [place] [address]  D-dimer, quantitative (not at [facility])     Status: Abnormal   Collection Time: 01/23/19  9:01 PM  Result Value Ref Range   D-Dimer, Quant 1.07 (H) 0.00 - 0.50 ug/mL-FEU    Comment: (NOTE) At the manufacturer cut-off of 0.50 ug/mL FEU, this assay has been documented to exclude PE with a sensitivity and negative predictive value of 97 to 99%.  At this time, this assay has not been approved by the FDA to exclude DVT/VTE. Results should be correlated with clinical presentation. Performed at [facility], [address] [place],  [address]     Chemistries  Recent Labs  Lab 01/23/19 1642 01/23/19 2101  NA 141  --   K 2.7*  --   CL 80*  --   CO2 31  --   GLUCOSE 197*  --   BUN 28*  --   CREATININE 1.88*  --   CALCIUM 9.7  --   MG  --  2.2  AST 31  --   ALT 21  --   ALKPHOS 109  --   BILITOT 1.3*  --     ------------------------------------------------------------------------------------------------------------------  ------------------------------------------------------------------------------------------------------------------ GFR: Estimated Creatinine Clearance: 14.5 mL/min (A) (by C-G formula based on SCr of 1.88 mg/dL (H)). Liver Function Tests: Recent Labs  Lab 01/23/19 1642  AST 31  ALT 21  ALKPHOS 109  BILITOT 1.3*  PROT 7.6  ALBUMIN 3.9   No results for input(s): LIPASE, AMYLASE in the last 168 hours. No results for input(s): AMMONIA in the last 168 hours. Coagulation Profile: No results for input(s): INR, PROTIME in the last 168 hours. Cardiac Enzymes: No results for input(s): CKTOTAL, CKMB, CKMBINDEX, TROPONINI in the last 168 hours. BNP (last 3 results) No results for input(s): PROBNP in the last 8760 hours. HbA1C: No results for input(s): HGBA1C in the last 72 hours. CBG: No results for input(s): GLUCAP in the last 168 hours. Lipid Profile: No results for input(s): CHOL, HDL, LDLCALC, TRIG, CHOLHDL, LDLDIRECT in the last 72 hours. Thyroid Function Tests: No results for input(s): TSH, T4TOTAL, FREET4, T3FREE, THYROIDAB in the last 72 hours. Anemia Panel: No results for input(s): VITAMINB12, FOLATE, FERRITIN, TIBC, IRON, RETICCTPCT in the last 72 hours.  --------------------------------------------------------------------------------------------------------------- Urine analysis:    Component Value Date/Time   COLORURINE YELLOW 01/23/2019 1642   APPEARANCEUR HAZY (A) 01/23/2019 1642   LABSPEC 1.016 01/23/2019 1642   PHURINE 8.0 01/23/2019 1642   GLUCOSEU NEGATIVE 01/23/2019 1642   GLUCOSEU NEGATIVE 11/25/2018 1206   HGBUR NEGATIVE 01/23/2019 1642   BILIRUBINUR NEGATIVE 01/23/2019 1642   BILIRUBINUR N 12/04/2018 1035   KETONESUR 20 (A) 01/23/2019 1642   PROTEINUR 30 (A) 01/23/2019 1642   UROBILINOGEN 0.2 12/04/2018 1035   UROBILINOGEN 0.2 11/25/2018  1206   NITRITE NEGATIVE 01/23/2019 1642   LEUKOCYTESUR NEGATIVE 01/23/2019 1642      Imaging Results:    Ct Abdomen Pelvis Wo Contrast  Result Date: 01/23/2019 CLINICAL DATA:  77 year old female with nausea and vomiting. EXAM: CT ABDOMEN AND PELVIS WITHOUT CONTRAST TECHNIQUE: Multidetector CT imaging of the abdomen and pelvis was performed following the standard protocol without IV contrast. COMPARISON:  None.  FINDINGS: Evaluation of this exam is limited in the absence of intravenous contrast. Lower chest: The visualized lung bases are clear. No intra-abdominal free air or free fluid. Hepatobiliary: There is mild fatty infiltration of the liver. No intrahepatic biliary ductal dilatation with cholecystectomy. No retained calcified stone noted in the central CBD. Pancreas: Unremarkable. No pancreatic ductal dilatation or surrounding inflammatory changes. Spleen: Normal in size without focal abnormality. Adrenals/Urinary Tract: The adrenal glands are unremarkable. There is a 3 mm nonobstructing right renal inferior pole calculus. No hydronephrosis. A 1 cm hypodense lesion in the interpolar aspect of the left kidney is not characterized. There is no hydronephrosis or nephrolithiasis on the left. The visualized ureters and urinary bladder appear unremarkable. Stomach/Bowel: There is mild circumferential thickening of distal esophagus which may be related to underdistention or represent mild esophagitis related to reflux. Small amount of fluid noted in the distal esophagus. The stomach is distended with fluid content. There is no evidence of small-bowel obstruction. Thickened appearance of the colon most likely related to underdistention. Colitis is considered less likely. Clinical correlation is recommended. Small scattered sigmoid diverticula without active inflammatory changes noted. The appendix is normal. Vascular/Lymphatic: Mild aortoiliac atherosclerotic disease. No portal venous gas. There is no  adenopathy. Reproductive: There is a 5 cm right adnexal cystic lesion as well as a smaller cystic lesion in the left ovary. Further evaluation pelvic ultrasound on a nonemergent basis recommended. The uterus appears retroflexed. Other: None Musculoskeletal: Scoliosis and degenerative changes of the spine. Multilevel degenerative disc disease with vacuum phenomena. No acute osseous pathology. Old healed right pubic bone fractures noted. IMPRESSION: 1. Underdistention of the distal esophagus versus mild esophagitis secondary to reflux. Clinical correlation is recommended. 2. Scattered colonic diverticula. No bowel obstruction. Normal appendix. 3. A 3 mm nonobstructing right renal inferior pole calculus. No hydronephrosis. 4. A 5 cm right adnexal cystic lesion. Further evaluation with pelvic ultrasound on a nonemergent basis recommended. 5. Mild fatty liver. 6. Aortic Atherosclerosis (ICD10-I70.0). Electronically Signed   By: Anner Crete M.D.   On: 01/23/2019 19:18   Ct Head Wo Contrast  Result Date: 01/23/2019 CLINICAL DATA:  Fall out of bed today. Vomiting x2. Congestion with ear pain. EXAM: CT HEAD WITHOUT CONTRAST TECHNIQUE: Contiguous axial images were obtained from the base of the skull through the vertex without intravenous contrast. COMPARISON:  09/07/2018 FINDINGS: Brain: Ventricles, cisterns and other CSF spaces are within normal. There is mild chronic ischemic microvascular disease. There is no mass, mass effect, shift of midline structures or acute hemorrhage. No evidence of acute infarction. Vascular: No hyperdense vessel or unexpected calcification. Skull: Normal. Negative for fracture or focal lesion. Sinuses/Orbits: No acute finding. Other: None. IMPRESSION: No acute findings. Mild chronic ischemic microvascular disease. Electronically Signed   By: Marin Olp M.D.   On: 01/23/2019 17:02   Dg Abd Acute W/chest  Result Date: 01/23/2019 CLINICAL DATA:  Vomiting. History of breast cancer.  EXAM: DG ABDOMEN ACUTE W/ 1V CHEST COMPARISON:  None. FINDINGS: Normal sized heart. Clear lungs. The lungs are mildly hyperexpanded. Paucity of intestinal gas with no dilated bowel loops, air-fluid levels or free peritoneal air seen. Lumbar spine degenerative changes and mild moderate dextroconvex scoliosis. IMPRESSION: No acute abnormality. Mild changes of COPD. Electronically Signed   By: Claudie Revering M.D.   On: 01/23/2019 18:12   ekg st at  130, St depression, T inversion in 2,3, avf, v3-6, and st elevation in avr, v1  Repeat EKG at slower rate, st at  110, nl axis, st depression, t inversion in 2, 3, avf, v3-6, trace st elevation in avr, v1    Assessment & Plan:    Principal Problem:   Nausea and vomiting Active Problems:   History of breast cancer   Anemia   Elevated troponin   Acute renal failure (ARF) (HCC)   Hypokalemia   Hyperglycemia   Tachycardia   Lactic acidosis   Abnormal EKG  Nausea and vomitting  ? Secondary to Sertraline CT scan -> suggestive of reflux vs reflux esophagitis pepcid given in ED STOP Zoloft Cont PPI, protonix 36m po bid zofran 472miv q6h prn   Elevated troponin w T inversion in v3-6, 2, 3, avf Cycle troponins Check hga1c, lipid Check cardiac echo Lovenox 81m27mkg [place] x1 in ED,  Start Aspirin 325m59m qday Start Carvedilol 3.125mg781mbid Start Lipitor 80mg 63mhs  Tachycardia,  Cycle troponin Check D dimer, if positive then VQ scan in AM  ARF Hydrate with ns iv Check cmp in am  Severe hypokalemia, with normal magnesium Replete Check cmp in am  Sepsis (hypothermia, tachycardia, wbc elevation, lactic acidosis) Blood culture x2 vanco iv, Cefepime iv pharmacy to dose Check cbc, cmp lactic acid in am  Hyperglycemia Check hga1c  Hypothyroidism Cont Levothyroxine 25 micrograms po qday  Gerd Cont PPI as above  Thrombocytosis (Jak pending) likely due to iron deficiency Platelet 985 (11/25/18) UTI then-> 910 (12/03/18) -> 650 (12/14/18)   Check cbc in am  Iron deficiency (iron saturation =5, ferritin 52, 12/14/18) Please start iron supplementation on discharge, not starting now due to GI upset, please warn may turn stool black   Hyperglycemia Check hga1c, if >6.5 please start fsbs, and ISS   DVT Prophylaxis-   Lovenox 81mg/kg37m x1, while trending troponins   - SCDs   AM Labs Ordered, also please review Full Orders  Family Communication: Admission, patients condition and plan of care including tests being ordered have been discussed with the patient and son  who indicate understanding and agree with the plan and Code Status.  Code Status:  FULL CODE,  Spoke with [facility] and [facility] trop[facility] elevated, and has ekg changes might require cardiology   Admission status: inpatient  : Based on patients clinical presentation and evaluation of above clinical data, I have made determination that patient meets inpatient criteria at this time. Pt has high possibility of clinical deterioration,  Pt hypothermic, wbc elevated, tachycardia, possibly septic, unclear source.  w intractable n/v, and elevated troponin.  Pt will require >2 nites stay.   Time spent in minutes :70   Irva Loser KJani Gravel 01/23/2019 at 10:49 PM

## 2019-01-23 NOTE — ED Notes (Signed)
Warm blankets placed on patient 

## 2019-01-23 NOTE — ED Notes (Signed)
Date and time results received: 01/23/19 5:30 PM  (use smartphrase ".now" to insert current time)  Test: lactic acid  Critical Value: 5.1  Name of Provider Notified: Frederico Hamman RN   Orders Received? Or Actions Taken?:

## 2019-01-23 NOTE — ED Provider Notes (Signed)
[facility] DEPT Provider Note   CSN: [id number] Arrival date & time: 01/23/19  1549    History   Chief Complaint Chief Complaint  Patient presents with   Emesis    HPI Jasmine Williams is a 78 y.o. female.     Patient is a 78 year old female with a history of breast cancer, dementia, depression and anemia who presents with vomiting and weakness.  She states that she has felt bad for about 2 or 3 days with diffuse's, decreased appetite and vomiting.  She states that she has not been able to keep anything down due to the vomiting.  She denies any abdominal pain.  No diarrhea.  No known fevers.  She denies any coughing or congestion.  No known sick contacts.  She states that she lives at home with her daughter and son-in-law.  She does have some bruising around her right eye and when I asked her about this she states that she tripped over something and fell a few days ago.  She denies any other injuries from the fall.  No neck or back pain.     Past Medical History:  Diagnosis Date   Cancer [facility])    right breast   History of chicken pox    History of recurrent UTIs    No pertinent past medical history     Patient Active Problem List   Diagnosis Date Noted   Depression, major, single episode, moderate ([place]) 12/18/2018   Malignant neoplasm of overlapping sites of right breast in female, estrogen receptor positive ([place]) 12/14/2018   Anemia 12/14/2018   Dementia ([place]) 12/14/2018   Thrombocytosis ([place]) 12/07/2018   History of breast cancer 08/23/2013   Postmenopausal estrogen deficiency 08/23/2013   Osteopenia 08/23/2013   Unspecified vitamin D deficiency 11/06/2012    Past Surgical History:  Procedure Laterality Date   BREAST LUMPECTOMY     right lump, snbx   broken leg     right as a child   CHOLECYSTECTOMY       OB History   No obstetric history on file.      Home Medications    Prior to Admission medications     Medication Sig Start Date End Date Taking? Authorizing Provider  levothyroxine (SYNTHROID) 25 MCG tablet Take 1 tablet (25 mcg total) by mouth daily before breakfast. Labs in 6-8 weeks 11/27/18  Yes Orma Flaming, MD  omeprazole (PRILOSEC OTC) 20 MG tablet Take 1 tablet (20 mg total) by mouth daily. 12/18/18  Yes Leamon Arnt, MD  sertraline (ZOLOFT) 50 MG tablet Take 1 tablet (50 mg total) by mouth at bedtime. 01/20/19  Yes Leamon Arnt, MD    Family History Family History  Problem Relation Age of Onset   Cancer Sister        breast    Social History Social History   Tobacco Use   Smoking status: Former Smoker    Quit date: 07/25/1971    Years since quitting: 47.5   Smokeless tobacco: Never Used  Substance Use Topics   Alcohol use: Yes    Comment: rare   Drug use: No     Allergies   Patient has no known allergies.   Review of Systems Review of Systems  Constitutional: Positive for appetite change and fatigue. Negative for chills, diaphoresis and fever.  HENT: Negative for congestion, rhinorrhea and sneezing.   Eyes: Negative.   Respiratory: Negative for cough, chest tightness and shortness of breath.  Cardiovascular: Negative for chest pain and leg swelling.  Gastrointestinal: Positive for nausea and vomiting. Negative for abdominal pain, blood in stool and diarrhea.  Genitourinary: Negative for difficulty urinating, flank pain, frequency and hematuria.  Musculoskeletal: Negative for arthralgias and back pain.  Skin: Negative for rash.  Neurological: Negative for dizziness, speech difficulty, weakness, numbness and headaches.     Physical Exam Updated Vital Signs BP (!) 142/82    Pulse (!) 110    Temp (!) 96.5 F (35.8 C) (Oral)    Resp (!) 25    LMP  (LMP Unknown)    SpO2 100%   Physical Exam Constitutional:      Appearance: She is well-developed.  HENT:     Head: Normocephalic.     Comments: Patient has some periorbital ecchymosis around the  right eye.  No erythema to the conjunctiva or subconjunctival hemorrhages.  No facial swelling.  No bony tenderness noted. Eyes:     Pupils: Pupils are equal, round, and reactive to light.  Neck:     Musculoskeletal: Normal range of motion and neck supple.     Comments: No pain along the spine, cervical thoracic or lumbosacral Cardiovascular:     Rate and Rhythm: Normal rate and regular rhythm.     Heart sounds: Normal heart sounds.  Pulmonary:     Effort: Pulmonary effort is normal. No respiratory distress.     Breath sounds: Normal breath sounds. No wheezing or rales.  Chest:     Chest wall: No tenderness.  Abdominal:     General: Bowel sounds are normal.     Palpations: Abdomen is soft.     Tenderness: There is no abdominal tenderness. There is no guarding or rebound.  Musculoskeletal: Normal range of motion.  Lymphadenopathy:     Cervical: No cervical adenopathy.  Skin:    General: Skin is warm and dry.     Findings: No rash.  Neurological:     Mental Status: She is alert and oriented to person, place, and time.      ED Treatments / Results  Labs (all labs ordered are listed, but only abnormal results are displayed) Labs Reviewed  COMPREHENSIVE METABOLIC PANEL - Abnormal; Notable for the following components:      Result Value   Potassium 2.7 (*)    Chloride 80 (*)    Glucose, Bld 197 (*)    BUN 28 (*)    Creatinine, Ser 1.88 (*)    Total Bilirubin 1.3 (*)    GFR calc non Af Amer 25 (*)    GFR calc Af Amer 29 (*)    Anion gap 30 (*)    All other components within normal limits  CBC WITH DIFFERENTIAL/PLATELET - Abnormal; Notable for the following components:   WBC 12.8 (*)    MCH 25.8 (*)    Platelets 704 (*)    Neutro Abs 10.7 (*)    Abs Immature Granulocytes 0.08 (*)    All other components within normal limits  LACTIC ACID, PLASMA - Abnormal; Notable for the following components:   Lactic Acid, Venous 5.1 (*)    All other components within normal limits   URINALYSIS, ROUTINE W REFLEX MICROSCOPIC - Abnormal; Notable for the following components:   APPearance HAZY (*)    Ketones, ur 20 (*)    Protein, ur 30 (*)    All other components within normal limits  TROPONIN I (HIGH SENSITIVITY) - Abnormal; Notable for the following components:   Troponin I (  High Sensitivity) 34.0 (*)    All other components within normal limits  SARS CORONAVIRUS 2 (HOSPITAL ORDER, [place] [facility])  URINE CULTURE  CULTURE, BLOOD (ROUTINE X 2)  CULTURE, BLOOD (ROUTINE X 2)  LACTIC ACID, PLASMA  MAGNESIUM  TROPONIN I (HIGH SENSITIVITY)    EKG EKG Interpretation  Date/Time:  Saturday January 23 2019 19:41:02 EDT Ventricular Rate:  127 PR Interval:    QRS Duration: 74 QT Interval:  321 QTC Calculation: 467 R Axis:   66 Text Interpretation:  Sinus tachycardia Probable LVH with secondary repol abnrm ST depression, consider ischemia, diffuse lds Confirmed by Malvin Johns [phone]) on 01/23/2019 7:56:29 PM   Radiology Ct Abdomen Pelvis Wo Contrast  Result Date: 01/23/2019 CLINICAL DATA:  78 year old female with nausea and vomiting. EXAM: CT ABDOMEN AND PELVIS WITHOUT CONTRAST TECHNIQUE: Multidetector CT imaging of the abdomen and pelvis was performed following the standard protocol without IV contrast. COMPARISON:  None. FINDINGS: Evaluation of this exam is limited in the absence of intravenous contrast. Lower chest: The visualized lung bases are clear. No intra-abdominal free air or free fluid. Hepatobiliary: There is mild fatty infiltration of the liver. No intrahepatic biliary ductal dilatation with cholecystectomy. No retained calcified stone noted in the central CBD. Pancreas: Unremarkable. No pancreatic ductal dilatation or surrounding inflammatory changes. Spleen: Normal in size without focal abnormality. Adrenals/Urinary Tract: The adrenal glands are unremarkable. There is a 3 mm nonobstructing right renal inferior pole calculus. No  hydronephrosis. A 1 cm hypodense lesion in the interpolar aspect of the left kidney is not characterized. There is no hydronephrosis or nephrolithiasis on the left. The visualized ureters and urinary bladder appear unremarkable. Stomach/Bowel: There is mild circumferential thickening of distal esophagus which may be related to underdistention or represent mild esophagitis related to reflux. Small amount of fluid noted in the distal esophagus. The stomach is distended with fluid content. There is no evidence of small-bowel obstruction. Thickened appearance of the colon most likely related to underdistention. Colitis is considered less likely. Clinical correlation is recommended. Small scattered sigmoid diverticula without active inflammatory changes noted. The appendix is normal. Vascular/Lymphatic: Mild aortoiliac atherosclerotic disease. No portal venous gas. There is no adenopathy. Reproductive: There is a 5 cm right adnexal cystic lesion as well as a smaller cystic lesion in the left ovary. Further evaluation pelvic ultrasound on a nonemergent basis recommended. The uterus appears retroflexed. Other: None Musculoskeletal: Scoliosis and degenerative changes of the spine. Multilevel degenerative disc disease with vacuum phenomena. No acute osseous pathology. Old healed right pubic bone fractures noted. IMPRESSION: 1. Underdistention of the distal esophagus versus mild esophagitis secondary to reflux. Clinical correlation is recommended. 2. Scattered colonic diverticula. No bowel obstruction. Normal appendix. 3. A 3 mm nonobstructing right renal inferior pole calculus. No hydronephrosis. 4. A 5 cm right adnexal cystic lesion. Further evaluation with pelvic ultrasound on a nonemergent basis recommended. 5. Mild fatty liver. 6. Aortic Atherosclerosis (ICD10-I70.0). Electronically Signed   By: Anner Crete M.D.   On: 01/23/2019 19:18   Ct Head Wo Contrast  Result Date: 01/23/2019 CLINICAL DATA:  Fall out of bed  today. Vomiting x2. Congestion with ear pain. EXAM: CT HEAD WITHOUT CONTRAST TECHNIQUE: Contiguous axial images were obtained from the base of the skull through the vertex without intravenous contrast. COMPARISON:  09/07/2018 FINDINGS: Brain: Ventricles, cisterns and other CSF spaces are within normal. There is mild chronic ischemic microvascular disease. There is no mass, mass effect, shift of midline structures or acute  hemorrhage. No evidence of acute infarction. Vascular: No hyperdense vessel or unexpected calcification. Skull: Normal. Negative for fracture or focal lesion. Sinuses/Orbits: No acute finding. Other: None. IMPRESSION: No acute findings. Mild chronic ischemic microvascular disease. Electronically Signed   By: Marin Olp M.D.   On: 01/23/2019 17:02   Dg Abd Acute W/chest  Result Date: 01/23/2019 CLINICAL DATA:  Vomiting. History of breast cancer. EXAM: DG ABDOMEN ACUTE W/ 1V CHEST COMPARISON:  None. FINDINGS: Normal sized heart. Clear lungs. The lungs are mildly hyperexpanded. Paucity of intestinal gas with no dilated bowel loops, air-fluid levels or free peritoneal air seen. Lumbar spine degenerative changes and mild moderate dextroconvex scoliosis. IMPRESSION: No acute abnormality. Mild changes of COPD. Electronically Signed   By: Claudie Revering M.D.   On: 01/23/2019 18:12    Procedures Procedures (including critical care time)  Medications Ordered in ED Medications  metroNIDAZOLE (FLAGYL) IVPB 500 mg (500 mg Intravenous New Bag/Given 01/23/19 2106)  vancomycin (VANCOCIN) IVPB 750 mg/150 ml premix (has no administration in time range)  famotidine (PEPCID) IVPB 20 mg premix (has no administration in time range)  potassium chloride 10 mEq in 100 mL IVPB ( Intravenous Stopped 01/23/19 2043)  sodium chloride 0.9 % bolus 500 mL (0 mLs Intravenous Stopped 01/23/19 2110)  sodium chloride 0.9 % bolus 500 mL (0 mLs Intravenous Stopped 01/23/19 2110)  ceFEPIme (MAXIPIME) 1 g in sodium chloride  0.9 % 100 mL IVPB (1 g Intravenous New Bag/Given 01/23/19 2052)     Initial Impression / Assessment and Plan / ED Course  I have reviewed the triage vital signs and the nursing notes.  Pertinent labs & imaging results that were available during my care of the patient were reviewed by me and considered in my medical decision making (see chart for details).        17:20 spoke with son who says that pt has been depressed since her husband died in Jun 20, 2023.  She hasn't been eating well since then.  Has lost a lot of weight.  Has seen her PCP who started her on remeron and thyroid medicine.  Went back recently and they switched her from remeron to zoloft.  They suspect that she has some dementia, but has not had appt yet with neurologist.  Patient is a 78 year old female who presents with vomiting.  Her abdomen is nontender.  She was a little hypothermic with tachycardia and tachypnea.  Her lactate was elevated at 5.  I initially was thinking that the lactate was more from the vomiting.  However her heart rate became more elevated and she came a little more tachypneic.  Given this I went ahead and treat her for possible sepsis with broad-spectrum antibiotics.  However I am not really finding a source of infection.  Her chest x-ray is clear without evidence of pneumonia.  Her urine is clear.  Her CT scan of her abdomen does not show any acute abnormalities.  There is an adnexal mass that will need follow-up.  She was noted to have marked hypokalemia and was started on potassium replacement.  She was given IV fluids at 30 cc/kg bolus given her elevated lactate.  She also was noted to have diffuse ST depression on EKG.  Given this, I added a troponin which is mildly elevated.  I spoke with the cardiology fellow who advises to trend the troponin and repeat an EKG once her tachycardia has improved.  They also recommended inpatient echocardiogram.  I spoke with Dr. Maudie Mercury who will  admit the patient for further  treatment.  CRITICAL CARE Performed by: Malvin Johns Total critical care time: 60 minutes Critical care time was exclusive of separately billable procedures and treating other patients. Critical care was necessary to treat or prevent imminent or life-threatening deterioration. Critical care was time spent personally by me on the following activities: development of treatment plan with patient and/or surrogate as well as nursing, discussions with consultants, evaluation of patient's response to treatment, examination of patient, obtaining history from patient or surrogate, ordering and performing treatments and interventions, ordering and review of laboratory studies, ordering and review of radiographic studies, pulse oximetry and re-evaluation of patient's condition.   Final Clinical Impressions(s) / ED Diagnoses   Final diagnoses:  Intractable vomiting with nausea, unspecified vomiting type  Hypokalemia  Adnexal mass    ED Discharge Orders    None       Malvin Johns, MD 01/23/19 2147

## 2019-01-23 NOTE — ED Notes (Addendum)
Patient Transported to CT.

## 2019-01-23 NOTE — ED Notes (Signed)
Patient provided with ice chips with MD permission.

## 2019-01-23 NOTE — ED Notes (Signed)
Patient transported to CT 

## 2019-01-23 NOTE — ED Notes (Signed)
ED Provider at bedside. 

## 2019-01-23 NOTE — ED Notes (Signed)
MD made aware of trending vital signs.

## 2019-01-24 ENCOUNTER — Inpatient Hospital Stay (HOSPITAL_COMMUNITY): Payer: Medicare Other

## 2019-01-24 DIAGNOSIS — B349 Viral infection, unspecified: Secondary | ICD-10-CM | POA: Diagnosis not present

## 2019-01-24 DIAGNOSIS — E876 Hypokalemia: Secondary | ICD-10-CM | POA: Diagnosis not present

## 2019-01-24 DIAGNOSIS — Z923 Personal history of irradiation: Secondary | ICD-10-CM | POA: Diagnosis not present

## 2019-01-24 DIAGNOSIS — R9431 Abnormal electrocardiogram [ECG] [EKG]: Secondary | ICD-10-CM

## 2019-01-24 DIAGNOSIS — Z681 Body mass index (BMI) 19 or less, adult: Secondary | ICD-10-CM | POA: Diagnosis not present

## 2019-01-24 DIAGNOSIS — R739 Hyperglycemia, unspecified: Secondary | ICD-10-CM | POA: Diagnosis not present

## 2019-01-24 DIAGNOSIS — E039 Hypothyroidism, unspecified: Secondary | ICD-10-CM | POA: Diagnosis not present

## 2019-01-24 DIAGNOSIS — E872 Acidosis: Secondary | ICD-10-CM | POA: Diagnosis not present

## 2019-01-24 DIAGNOSIS — R6510 Systemic inflammatory response syndrome (SIRS) of non-infectious origin without acute organ dysfunction: Secondary | ICD-10-CM | POA: Diagnosis present

## 2019-01-24 DIAGNOSIS — W1800XA Striking against unspecified object with subsequent fall, initial encounter: Secondary | ICD-10-CM | POA: Diagnosis not present

## 2019-01-24 DIAGNOSIS — E869 Volume depletion, unspecified: Secondary | ICD-10-CM | POA: Diagnosis not present

## 2019-01-24 DIAGNOSIS — S0011XA Contusion of right eyelid and periocular area, initial encounter: Secondary | ICD-10-CM | POA: Diagnosis not present

## 2019-01-24 DIAGNOSIS — R7989 Other specified abnormal findings of blood chemistry: Secondary | ICD-10-CM | POA: Diagnosis not present

## 2019-01-24 DIAGNOSIS — Z853 Personal history of malignant neoplasm of breast: Secondary | ICD-10-CM | POA: Diagnosis not present

## 2019-01-24 DIAGNOSIS — D509 Iron deficiency anemia, unspecified: Secondary | ICD-10-CM | POA: Diagnosis not present

## 2019-01-24 DIAGNOSIS — N179 Acute kidney failure, unspecified: Secondary | ICD-10-CM | POA: Diagnosis present

## 2019-01-24 DIAGNOSIS — F0390 Unspecified dementia without behavioral disturbance: Secondary | ICD-10-CM | POA: Diagnosis present

## 2019-01-24 DIAGNOSIS — J449 Chronic obstructive pulmonary disease, unspecified: Secondary | ICD-10-CM | POA: Diagnosis not present

## 2019-01-24 DIAGNOSIS — A419 Sepsis, unspecified organism: Secondary | ICD-10-CM | POA: Diagnosis present

## 2019-01-24 DIAGNOSIS — Z1159 Encounter for screening for other viral diseases: Secondary | ICD-10-CM | POA: Diagnosis not present

## 2019-01-24 DIAGNOSIS — F329 Major depressive disorder, single episode, unspecified: Secondary | ICD-10-CM | POA: Diagnosis not present

## 2019-01-24 DIAGNOSIS — R112 Nausea with vomiting, unspecified: Secondary | ICD-10-CM | POA: Diagnosis present

## 2019-01-24 DIAGNOSIS — Z1501 Genetic susceptibility to malignant neoplasm of breast: Secondary | ICD-10-CM | POA: Diagnosis not present

## 2019-01-24 DIAGNOSIS — E860 Dehydration: Secondary | ICD-10-CM | POA: Diagnosis not present

## 2019-01-24 DIAGNOSIS — K219 Gastro-esophageal reflux disease without esophagitis: Secondary | ICD-10-CM | POA: Diagnosis not present

## 2019-01-24 DIAGNOSIS — E43 Unspecified severe protein-calorie malnutrition: Secondary | ICD-10-CM | POA: Diagnosis not present

## 2019-01-24 DIAGNOSIS — R000 Tachycardia, unspecified: Secondary | ICD-10-CM | POA: Diagnosis not present

## 2019-01-24 DIAGNOSIS — D649 Anemia, unspecified: Secondary | ICD-10-CM | POA: Diagnosis not present

## 2019-01-24 LAB — LIPID PANEL
Cholesterol: 152 mg/dL (ref 0–200)
HDL: 81 mg/dL (ref >40)
LDL Cholesterol: 57 mg/dL (ref 0–99)
Total CHOL/HDL Ratio: 1.9 RATIO
Triglycerides: 69 mg/dL (ref <150)
VLDL: 14 mg/dL (ref 0–40)

## 2019-01-24 LAB — CBC
HCT: 33 % — ABNORMAL LOW (ref 36.0–46.0)
Hemoglobin: 10.1 g/dL — ABNORMAL LOW (ref 12.0–15.0)
MCH: 26.9 pg (ref 26.0–34.0)
MCHC: 30.6 g/dL (ref 30.0–36.0)
MCV: 87.8 fL (ref 80.0–100.0)
Platelets: 514 10*3/uL — ABNORMAL HIGH (ref 150–400)
RBC: 3.76 MIL/uL — ABNORMAL LOW (ref 3.87–5.11)
RDW: 14.8 % (ref 11.5–15.5)
WBC: 11.4 10*3/uL — ABNORMAL HIGH (ref 4.0–10.5)
nRBC: 0 % (ref 0.0–0.2)

## 2019-01-24 LAB — COMPREHENSIVE METABOLIC PANEL
ALT: 15 U/L (ref 0–44)
AST: 22 U/L (ref 15–41)
Albumin: 2.7 g/dL — ABNORMAL LOW (ref 3.5–5.0)
Alkaline Phosphatase: 73 U/L (ref 38–126)
Anion gap: 17 — ABNORMAL HIGH (ref 5–15)
BUN: 22 mg/dL (ref 8–23)
CO2: 25 mmol/L (ref 22–32)
Calcium: 7.4 mg/dL — ABNORMAL LOW (ref 8.9–10.3)
Chloride: 98 mmol/L (ref 98–111)
Creatinine, Ser: 1.42 mg/dL — ABNORMAL HIGH (ref 0.44–1.00)
GFR calc Af Amer: 41 mL/min — ABNORMAL LOW (ref >60)
GFR calc non Af Amer: 36 mL/min — ABNORMAL LOW (ref >60)
Glucose, Bld: 86 mg/dL (ref 70–99)
Potassium: 3.1 mmol/L — ABNORMAL LOW (ref 3.5–5.1)
Sodium: 140 mmol/L (ref 135–145)
Total Bilirubin: 1.4 mg/dL — ABNORMAL HIGH (ref 0.3–1.2)
Total Protein: 5.6 g/dL — ABNORMAL LOW (ref 6.5–8.1)

## 2019-01-24 LAB — ECHOCARDIOGRAM COMPLETE
Height: 60 in
Weight: 1269.85 oz

## 2019-01-24 LAB — LACTIC ACID, PLASMA: Lactic Acid, Venous: 1.2 mmol/L (ref 0.5–1.9)

## 2019-01-24 LAB — TROPONIN I (HIGH SENSITIVITY): Troponin I (High Sensitivity): 44 ng/L — ABNORMAL HIGH (ref <18)

## 2019-01-24 LAB — PROCALCITONIN: Procalcitonin: 0.14 ng/mL

## 2019-01-24 LAB — MAGNESIUM: Magnesium: 2 mg/dL (ref 1.7–2.4)

## 2019-01-24 MED ORDER — ENOXAPARIN SODIUM 40 MG/0.4ML ~~LOC~~ SOLN
1.0000 mg/kg | Freq: Once | SUBCUTANEOUS | Status: AC
  Administered 2019-01-24: 35 mg via SUBCUTANEOUS
  Filled 2019-01-24: qty 0.4

## 2019-01-24 MED ORDER — SODIUM CHLORIDE 0.9 % IV SOLN
2.0000 g | INTRAVENOUS | Status: DC
  Administered 2019-01-24 – 2019-01-25 (×2): 2 g via INTRAVENOUS
  Filled 2019-01-24 (×3): qty 2

## 2019-01-24 MED ORDER — CARVEDILOL 3.125 MG PO TABS
3.1250 mg | ORAL_TABLET | Freq: Two times a day (BID) | ORAL | Status: DC
  Administered 2019-01-24 (×2): 3.125 mg via ORAL
  Filled 2019-01-24 (×3): qty 1

## 2019-01-24 MED ORDER — VANCOMYCIN HCL 500 MG IV SOLR
500.0000 mg | INTRAVENOUS | Status: DC
  Filled 2019-01-24: qty 500

## 2019-01-24 MED ORDER — ATORVASTATIN CALCIUM 80 MG PO TABS
80.0000 mg | ORAL_TABLET | Freq: Every day | ORAL | Status: DC
  Administered 2019-01-24: 80 mg via ORAL
  Filled 2019-01-24: qty 1

## 2019-01-24 MED ORDER — TECHNETIUM TO 99M ALBUMIN AGGREGATED
1.6500 | Freq: Once | INTRAVENOUS | Status: AC | PRN
  Administered 2019-01-24: 1.65 via INTRAVENOUS

## 2019-01-24 MED ORDER — POTASSIUM CHLORIDE 10 MEQ/100ML IV SOLN
10.0000 meq | INTRAVENOUS | Status: AC
  Administered 2019-01-24 (×2): 10 meq via INTRAVENOUS
  Filled 2019-01-24 (×2): qty 100

## 2019-01-24 MED ORDER — ENOXAPARIN SODIUM 40 MG/0.4ML ~~LOC~~ SOLN
40.0000 mg | SUBCUTANEOUS | Status: DC
  Administered 2019-01-24: 20:00:00 40 mg via SUBCUTANEOUS
  Filled 2019-01-24: qty 0.4

## 2019-01-24 MED ORDER — POTASSIUM CHLORIDE 10 MEQ/100ML IV SOLN
10.0000 meq | INTRAVENOUS | Status: AC
  Administered 2019-01-24 (×4): 10 meq via INTRAVENOUS
  Filled 2019-01-24 (×4): qty 100

## 2019-01-24 MED ORDER — POTASSIUM CHLORIDE IN NACL 20-0.9 MEQ/L-% IV SOLN
INTRAVENOUS | Status: DC
  Administered 2019-01-24: 20:00:00 via INTRAVENOUS
  Filled 2019-01-24: qty 1000

## 2019-01-24 NOTE — Plan of Care (Signed)

## 2019-01-24 NOTE — ED Notes (Signed)
[facility] called for transportation.   

## 2019-01-24 NOTE — Progress Notes (Signed)
  Echocardiogram 2D Echocardiogram has been performed.  Jasmine Williams 01/24/2019, 11:28 AM

## 2019-01-24 NOTE — Progress Notes (Signed)
Pharmacy Antibiotic Note  Jasmine Williams is a 78 y.o. female admitted on 01/23/2019 with sepsis.  Pharmacy has been consulted for Vancomycin, cefepime dosing.  Plan: Vancomycin 750mg  iv x1 Vancomycin 500 mg IV Q 48 hrs. Goal AUC 400-550. Expected AUC: 489 SCr used: 1.88  Cefepime 1gm iv x1 --then begin 2gm iv q24hr at 0600      Temp (24hrs), Avg:96.5 F (35.8 C), Min:96.5 F (35.8 C), Max:96.5 F (35.8 C)  Recent Labs  Lab 01/23/19 1549 01/23/19 1642 01/23/19 2101  WBC  --  12.8*  --   CREATININE  --  1.88*  --   LATICACIDVEN 5.1*  --  2.9*    Estimated Creatinine Clearance: 14.5 mL/min (A) (by C-G formula based on SCr of 1.88 mg/dL (H)).    No Known Allergies  Antimicrobials this admission: Vancomycin 01/23/2019 >> Cefepime 01/23/2019 >>   Dose adjustments this admission: -  Microbiology results: -  Thank you for allowing pharmacy to be a part of this patient's care.  Nani Skillern Crowford 01/24/2019 12:13 AM

## 2019-01-24 NOTE — Progress Notes (Addendum)
PROGRESS NOTE    Jasmine Williams  GMW:[id number] DOB: 1940/07/28 DOA: 01/23/2019 PCP: Orma Flaming, MD  Outpatient Specialists:   Brief Narrative:  Patient is a 78 year old Caucasian female with history of breast cancer.  Patient was admitted with 2-day history of nausea, vomiting, acute kidney injury and electrolyte abnormalities.  Orientation, the lactic acid was 5.13, but improved to 2.9 with hydration.  Potassium was 2.7 and is currently being repleted, BUN was 28 with serum creatinine of 1.88.  Patient's baseline serum creatinine is 0.8.  Thrombocytosis of 704 is noted.  01/24/2019: No nausea vomiting since admission.  Continue IV fluid resuscitation.  Monitor electrolytes and renal function closely.  Continue to replete potassium.  Start clear liquids and advance as tolerated.  Hopefully, patient will be discharged back home tomorrow.  Assessment & Plan:   Principal Problem:   Nausea and vomiting Active Problems:   History of breast cancer   Anemia   Elevated troponin   Acute renal failure (ARF) (HCC)   Hypokalemia   Hyperglycemia   Tachycardia   Lactic acidosis   Abnormal EKG   Sepsis (HCC)  Nausea and vomitting, cause unknown: No further nausea and vomiting since admission.   Continue supportive care.   Urine is nonrevealing.   CT scan of the abdomen and pelvis has not shown any significant findings.   Thrombocytosis is resolving.   Hypokalemia is being repleted.   AKI is resolving (likely prerenal)  Start clear liquids, and advance as tolerated. Further management will depend on hospital course   Elevated troponin w T inversion in v3-6, 2, 3, avf Cycle troponins Suspect type II elevation of troponin. Repeat troponin in the morning.  Tachycardia,  Likely related to volume depletion.   VQ scan is low probability for PE.    Acute kidney injury, likely prerenal:  Continue IV fluids.   Renal function is improving with hydration.   Serum creatinine has gone  from 1.88-1.42 with hydration. I expect full recovery of the renal function  Severe hypokalemia:  Likely related to severe nausea and vomiting.   Continue to correct.   Magnesium level is within normal range.  IV KCl ordered.    Sepsis (hypothermia, tachycardia, wbc elevation, lactic acidosis) Blood culture x2 vanco iv, Cefepime iv pharmacy to dose Check cbc, cmp lactic acid in am 01/24/2019: Above documentation of sepsis is as per H&P.  Lactic acid has resolved with hydration.  Thrombocytosis is improving, however, patient is on IV vancomycin and cefepime.  Will check procalcitonin.  Urine is nonrevealing.  CT scan of the abdomen has not revealed any significant findings.  No chest symptoms.  No hyperthermia noted since admission.  Blood pressure has been stable.  This may just be SIRS.  Will have a low threshold to discontinue antibiotics.  Hypothyroidism Cont Levothyroxine 25 micrograms po qday  Gerd Cont PPI as above  Thrombocytosis (Jak pending) likely due to iron deficiency Platelet 985 (11/25/18) UTI then-> 910 (12/03/18) -> 650 (12/14/18)  Check cbc in am 01/24/2019: Possibly reactive.  Continue to monitor closely.  Iron deficiency (iron saturation =5, ferritin 52, 12/14/18) Please start iron supplementation on discharge, not starting now due to GI upset, please warn may turn stool black   Moderate malnutrition (PEM): Consider calorie count when patient obtains full diet. Low threshold to consult dietary team.  DVT prophylaxis: Subcutaneous Lovenox ordered Code Status: Full code Family Communication:  Disposition Plan: Likely home   Consultants:   None  Procedures:   None  Antimicrobials:   IV vancomycin  IV cefepime  Have a low threshold to discontinue antibiotics.   Subjective: Nausea and vomiting have resolved. No fever or chills. No abdominal pain. No chest symptoms.  Objective: Vitals:   01/24/19 0400 01/24/19 0414 01/24/19 0452 01/24/19 0453    BP: (!) 102/42 (!) 107/59  135/60  Pulse: 86 85  81  Resp: 16 14  11   Temp:    97.7 F (36.5 C)  TempSrc:    Oral  SpO2: 96% 96%  98%  Weight:   36 kg   Height:   5' (1.524 m)     Intake/Output Summary (Last 24 hours) at 01/24/2019 1102 Last data filed at 01/24/2019 0627 Gross per 24 hour  Intake 2092.8 ml  Output --  Net 2092.8 ml   Filed Weights   01/24/19 0452  Weight: 36 kg    Examination:  General exam: Cachectic.  Hard of hearing.  Appears calm and comfortable  Respiratory system: Clear to auscultation.  Cardiovascular system: S1 & S2 heard.   Gastrointestinal system: Abdomen is nondistended, soft and nontender. No organomegaly or masses felt. Normal bowel sounds heard. Central nervous system: Alert and oriented.  Patient moves all extremities. Extremities: No leg edema.  Data Reviewed: I have personally reviewed following labs and imaging studies  CBC: Recent Labs  Lab 01/23/19 1642  WBC 12.8*  NEUTROABS 10.7*  HGB 12.6  HCT 41.4  MCV 84.7  PLT 468*   Basic Metabolic Panel: Recent Labs  Lab 01/23/19 1642 01/23/19 2101  NA 141  --   K 2.7*  --   CL 80*  --   CO2 31  --   GLUCOSE 197*  --   BUN 28*  --   CREATININE 1.88*  --   CALCIUM 9.7  --   MG  --  2.2   GFR: Estimated Creatinine Clearance: 14.2 mL/min (A) (by C-G formula based on SCr of 1.88 mg/dL (H)). Liver Function Tests: Recent Labs  Lab 01/23/19 1642  AST 31  ALT 21  ALKPHOS 109  BILITOT 1.3*  PROT 7.6  ALBUMIN 3.9   No results for input(s): LIPASE, AMYLASE in the last 168 hours. No results for input(s): AMMONIA in the last 168 hours. Coagulation Profile: No results for input(s): INR, PROTIME in the last 168 hours. Cardiac Enzymes: No results for input(s): CKTOTAL, CKMB, CKMBINDEX, TROPONINI in the last 168 hours. BNP (last 3 results) No results for input(s): PROBNP in the last 8760 hours. HbA1C: No results for input(s): HGBA1C in the last 72 hours. CBG: No results for  input(s): GLUCAP in the last 168 hours. Lipid Profile: No results for input(s): CHOL, HDL, LDLCALC, TRIG, CHOLHDL, LDLDIRECT in the last 72 hours. Thyroid Function Tests: No results for input(s): TSH, T4TOTAL, FREET4, T3FREE, THYROIDAB in the last 72 hours. Anemia Panel: No results for input(s): VITAMINB12, FOLATE, FERRITIN, TIBC, IRON, RETICCTPCT in the last 72 hours. Urine analysis:    Component Value Date/Time   COLORURINE YELLOW 01/23/2019 1642   APPEARANCEUR HAZY (A) 01/23/2019 1642   LABSPEC 1.016 01/23/2019 1642   PHURINE 8.0 01/23/2019 1642   GLUCOSEU NEGATIVE 01/23/2019 1642   GLUCOSEU NEGATIVE 11/25/2018 1206   HGBUR NEGATIVE 01/23/2019 1642   BILIRUBINUR NEGATIVE 01/23/2019 1642   BILIRUBINUR N 12/04/2018 1035   KETONESUR 20 (A) 01/23/2019 1642   PROTEINUR 30 (A) 01/23/2019 1642   UROBILINOGEN 0.2 12/04/2018 1035   UROBILINOGEN 0.2 11/25/2018 1206   NITRITE NEGATIVE 01/23/2019 1642  LEUKOCYTESUR NEGATIVE 01/23/2019 1642   Sepsis Labs: @LABRCNTIP (procalcitonin:4,lacticidven:4)  ) Recent Results (from the past 240 hour(s))  SARS Coronavirus 2 (CEPHEID - Performed in [place] hospital lab), Hosp Order     Status: None   Collection Time: 01/23/19  4:42 PM   Specimen: Nasopharyngeal Swab  Result Value Ref Range Status   SARS Coronavirus 2 NEGATIVE NEGATIVE Final    Comment: (NOTE) If result is NEGATIVE SARS-CoV-2 target nucleic acids are NOT DETECTED. The SARS-CoV-2 RNA is generally detectable in upper and lower  respiratory specimens during the acute phase of infection. The lowest  concentration of SARS-CoV-2 viral copies this assay can detect is 250  copies / mL. A negative result does not preclude SARS-CoV-2 infection  and should not be used as the sole basis for treatment or other  patient management decisions.  A negative result may occur with  improper specimen collection / handling, submission of specimen other  than nasopharyngeal swab, presence of  viral mutation(s) within the  areas targeted by this assay, and inadequate number of viral copies  (<250 copies / mL). A negative result must be combined with clinical  observations, patient history, and epidemiological information. If result is POSITIVE SARS-CoV-2 target nucleic acids are DETECTED. The SARS-CoV-2 RNA is generally detectable in upper and lower  respiratory specimens dur ing the acute phase of infection.  Positive  results are indicative of active infection with SARS-CoV-2.  Clinical  correlation with patient history and other diagnostic information is  necessary to determine patient infection status.  Positive results do  not rule out bacterial infection or co-infection with other viruses. If result is PRESUMPTIVE POSTIVE SARS-CoV-2 nucleic acids MAY BE PRESENT.   A presumptive positive result was obtained on the submitted specimen  and confirmed on repeat testing.  While 2019 novel coronavirus  (SARS-CoV-2) nucleic acids may be present in the submitted sample  additional confirmatory testing may be necessary for epidemiological  and / or clinical management purposes  to differentiate between  SARS-CoV-2 and other Sarbecovirus currently known to infect humans.  If clinically indicated additional testing with an alternate test  methodology [phone]) is advised. The SARS-CoV-2 RNA is generally  detectable in upper and lower respiratory sp ecimens during the acute  phase of infection. The expected result is Negative. Fact Sheet for Patients:  StrictlyIdeas.no Fact Sheet for Healthcare Providers: BankingDealers.co.za This test is not yet approved or cleared by the [place] FDA and has been authorized for detection and/or diagnosis of SARS-CoV-2 by FDA under an Emergency Use Authorization (EUA).  This EUA will remain in effect (meaning this test can be used) for the duration of the COVID-19 declaration under Section  564(b)(1) of the Act, 21 U.S.C. section 360bbb-3(b)(1), unless the authorization is terminated or revoked sooner. Performed at [facility], [place] [address] [place], [place] [address]          Radiology Studies: Ct Abdomen Pelvis Wo Contrast  Result Date: 01/23/2019 CLINICAL DATA:  78 year old female with nausea and vomiting. EXAM: CT ABDOMEN AND PELVIS WITHOUT CONTRAST TECHNIQUE: Multidetector CT imaging of the abdomen and pelvis was performed following the standard protocol without IV contrast. COMPARISON:  None. FINDINGS: Evaluation of this exam is limited in the absence of intravenous contrast. Lower chest: The visualized lung bases are clear. No intra-abdominal free air or free fluid. Hepatobiliary: There is mild fatty infiltration of the liver. No intrahepatic biliary ductal dilatation with cholecystectomy. No retained calcified stone noted in the central CBD.  Pancreas: Unremarkable. No pancreatic ductal dilatation or surrounding inflammatory changes. Spleen: Normal in size without focal abnormality. Adrenals/Urinary Tract: The adrenal glands are unremarkable. There is a 3 mm nonobstructing right renal inferior pole calculus. No hydronephrosis. A 1 cm hypodense lesion in the interpolar aspect of the left kidney is not characterized. There is no hydronephrosis or nephrolithiasis on the left. The visualized ureters and urinary bladder appear unremarkable. Stomach/Bowel: There is mild circumferential thickening of distal esophagus which may be related to underdistention or represent mild esophagitis related to reflux. Small amount of fluid noted in the distal esophagus. The stomach is distended with fluid content. There is no evidence of small-bowel obstruction. Thickened appearance of the colon most likely related to underdistention. Colitis is considered less likely. Clinical correlation is recommended. Small scattered sigmoid diverticula without active inflammatory changes  noted. The appendix is normal. Vascular/Lymphatic: Mild aortoiliac atherosclerotic disease. No portal venous gas. There is no adenopathy. Reproductive: There is a 5 cm right adnexal cystic lesion as well as a smaller cystic lesion in the left ovary. Further evaluation pelvic ultrasound on a nonemergent basis recommended. The uterus appears retroflexed. Other: None Musculoskeletal: Scoliosis and degenerative changes of the spine. Multilevel degenerative disc disease with vacuum phenomena. No acute osseous pathology. Old healed right pubic bone fractures noted. IMPRESSION: 1. Underdistention of the distal esophagus versus mild esophagitis secondary to reflux. Clinical correlation is recommended. 2. Scattered colonic diverticula. No bowel obstruction. Normal appendix. 3. A 3 mm nonobstructing right renal inferior pole calculus. No hydronephrosis. 4. A 5 cm right adnexal cystic lesion. Further evaluation with pelvic ultrasound on a nonemergent basis recommended. 5. Mild fatty liver. 6. Aortic Atherosclerosis (ICD10-I70.0). Electronically Signed   By: Anner Crete M.D.   On: 01/23/2019 19:18   Ct Head Wo Contrast  Result Date: 01/23/2019 CLINICAL DATA:  Fall out of bed today. Vomiting x2. Congestion with ear pain. EXAM: CT HEAD WITHOUT CONTRAST TECHNIQUE: Contiguous axial images were obtained from the base of the skull through the vertex without intravenous contrast. COMPARISON:  09/07/2018 FINDINGS: Brain: Ventricles, cisterns and other CSF spaces are within normal. There is mild chronic ischemic microvascular disease. There is no mass, mass effect, shift of midline structures or acute hemorrhage. No evidence of acute infarction. Vascular: No hyperdense vessel or unexpected calcification. Skull: Normal. Negative for fracture or focal lesion. Sinuses/Orbits: No acute finding. Other: None. IMPRESSION: No acute findings. Mild chronic ischemic microvascular disease. Electronically Signed   By: Marin Olp M.D.    On: 01/23/2019 17:02   Nm Pulmonary Perfusion  Result Date: 01/24/2019 CLINICAL DATA:  Suspected pulmonary embolism. Tachycardia. Elevated D-dimer. EXAM: NUCLEAR MEDICINE PERFUSION LUNG SCAN TECHNIQUE: Perfusion images were obtained in multiple projections after intravenous injection of radiopharmaceutical. Ventilation scans intentionally deferred if perfusion scan and chest x-ray adequate for interpretation during COVID 19 epidemic. RADIOPHARMACEUTICALS:  1.65 mCi Tc-48m MAA IV COMPARISON:  Chest x-ray dated 01/23/2019. FINDINGS: No perfusion defect is identified within either lung. IMPRESSION: No evidence of pulmonary embolism. Electronically Signed   By: Franki Cabot M.D.   On: 01/24/2019 09:36   Dg Abd Acute W/chest  Result Date: 01/23/2019 CLINICAL DATA:  Vomiting. History of breast cancer. EXAM: DG ABDOMEN ACUTE W/ 1V CHEST COMPARISON:  None. FINDINGS: Normal sized heart. Clear lungs. The lungs are mildly hyperexpanded. Paucity of intestinal gas with no dilated bowel loops, air-fluid levels or free peritoneal air seen. Lumbar spine degenerative changes and mild moderate dextroconvex scoliosis. IMPRESSION: No acute abnormality. Mild changes of COPD.  Electronically Signed   By: Claudie Revering M.D.   On: 01/23/2019 18:12        Scheduled Meds:  aspirin EC  325 mg Oral Daily   atorvastatin  80 mg Oral q1800   carvedilol  3.125 mg Oral BID WC   enoxaparin (LOVENOX) injection  1 mg/kg Subcutaneous Once   Continuous Infusions:  ceFEPime (MAXIPIME) IV 2 g (01/24/19 0702)   potassium chloride     [START ON 01/25/2019] vancomycin       LOS: 0 days    Time spent: 35 minutes.    Dana Allan, MD  [facility] Pager #: [phone] 7PM-7AM contact night coverage as above

## 2019-01-25 DIAGNOSIS — E872 Acidosis: Secondary | ICD-10-CM

## 2019-01-25 DIAGNOSIS — D649 Anemia, unspecified: Secondary | ICD-10-CM

## 2019-01-25 LAB — BASIC METABOLIC PANEL
Anion gap: 9 (ref 5–15)
BUN: 18 mg/dL (ref 8–23)
CO2: 24 mmol/L (ref 22–32)
Calcium: 7 mg/dL — ABNORMAL LOW (ref 8.9–10.3)
Chloride: 104 mmol/L (ref 98–111)
Creatinine, Ser: 0.96 mg/dL (ref 0.44–1.00)
GFR calc Af Amer: >60 mL/min (ref >60)
GFR calc non Af Amer: 57 mL/min — ABNORMAL LOW (ref >60)
Glucose, Bld: 86 mg/dL (ref 70–99)
Potassium: 4 mmol/L (ref 3.5–5.1)
Sodium: 137 mmol/L (ref 135–145)

## 2019-01-25 LAB — RENAL FUNCTION PANEL
Albumin: 2.2 g/dL — ABNORMAL LOW (ref 3.5–5.0)
Anion gap: 10 (ref 5–15)
BUN: 18 mg/dL (ref 8–23)
CO2: 23 mmol/L (ref 22–32)
Calcium: 6.9 mg/dL — ABNORMAL LOW (ref 8.9–10.3)
Chloride: 104 mmol/L (ref 98–111)
Creatinine, Ser: 1 mg/dL (ref 0.44–1.00)
GFR calc Af Amer: >60 mL/min (ref >60)
GFR calc non Af Amer: 54 mL/min — ABNORMAL LOW (ref >60)
Glucose, Bld: 86 mg/dL (ref 70–99)
Phosphorus: 1.7 mg/dL — ABNORMAL LOW (ref 2.5–4.6)
Potassium: 4 mmol/L (ref 3.5–5.1)
Sodium: 137 mmol/L (ref 135–145)

## 2019-01-25 LAB — URINE CULTURE: Culture: NO GROWTH

## 2019-01-25 LAB — CBC WITH DIFFERENTIAL/PLATELET
Abs Immature Granulocytes: 0.05 10*3/uL (ref 0.00–0.07)
Basophils Absolute: 0 10*3/uL (ref 0.0–0.1)
Basophils Relative: 0 %
Eosinophils Absolute: 0.1 10*3/uL (ref 0.0–0.5)
Eosinophils Relative: 1 %
HCT: 29.3 % — ABNORMAL LOW (ref 36.0–46.0)
Hemoglobin: 8.9 g/dL — ABNORMAL LOW (ref 12.0–15.0)
Immature Granulocytes: 1 %
Lymphocytes Relative: 19 %
Lymphs Abs: 2 10*3/uL (ref 0.7–4.0)
MCH: 26.4 pg (ref 26.0–34.0)
MCHC: 30.4 g/dL (ref 30.0–36.0)
MCV: 86.9 fL (ref 80.0–100.0)
Monocytes Absolute: 0.7 10*3/uL (ref 0.1–1.0)
Monocytes Relative: 7 %
Neutro Abs: 7.6 10*3/uL (ref 1.7–7.7)
Neutrophils Relative %: 72 %
Platelets: 426 10*3/uL — ABNORMAL HIGH (ref 150–400)
RBC: 3.37 MIL/uL — ABNORMAL LOW (ref 3.87–5.11)
RDW: 14.8 % (ref 11.5–15.5)
WBC: 10.5 10*3/uL (ref 4.0–10.5)
nRBC: 0 % (ref 0.0–0.2)

## 2019-01-25 LAB — MAGNESIUM: Magnesium: 1.8 mg/dL (ref 1.7–2.4)

## 2019-01-25 MED ORDER — SERTRALINE HCL 50 MG PO TABS: 50.0000 mg | ORAL_TABLET | Freq: Every day | ORAL | Status: DC

## 2019-01-25 MED ORDER — MIRTAZAPINE 7.5 MG PO TABS: 15.0000 mg | ORAL_TABLET | Freq: Every day | ORAL | Status: DC

## 2019-01-25 MED ORDER — LEVOTHYROXINE SODIUM 25 MCG PO TABS: 25.0000 ug | ORAL_TABLET | Freq: Every day | ORAL | Status: DC

## 2019-01-25 MED ORDER — OMEPRAZOLE MAGNESIUM 20 MG PO TBEC: 20.0000 mg | DELAYED_RELEASE_TABLET | Freq: Every day | ORAL | Status: DC

## 2019-01-25 MED ORDER — MIRTAZAPINE 15 MG PO TABS: 15.0000 mg | ORAL_TABLET | Freq: Every day | ORAL | 1 refills | Status: AC

## 2019-01-25 MED ORDER — PANTOPRAZOLE SODIUM 40 MG PO TBEC
40.0000 mg | DELAYED_RELEASE_TABLET | Freq: Every day | ORAL | Status: DC
  Filled 2019-01-25: qty 1

## 2019-01-25 MED ORDER — PANTOPRAZOLE SODIUM 40 MG PO TBEC: 40.0000 mg | DELAYED_RELEASE_TABLET | Freq: Every day | ORAL | 0 refills | Status: AC

## 2019-01-25 MED ORDER — SERTRALINE HCL 100 MG PO TABS: 100.0000 mg | ORAL_TABLET | Freq: Every day | ORAL | Status: DC

## 2019-01-25 MED ORDER — ENOXAPARIN SODIUM 30 MG/0.3ML ~~LOC~~ SOLN: 30.0000 mg | SUBCUTANEOUS | Status: DC

## 2019-01-25 MED FILL — PANTOPRAZOLE SOD DR 40 MG T: 40 | 30 days supply | Qty: 30 | Fill #0

## 2019-01-25 MED FILL — MIRTAZAPINE 15 MG TABLET: 15 | 30 days supply | Qty: 30 | Fill #0

## 2019-01-25 NOTE — TOC Transition Note (Signed)
Transition of Care [facility]) - CM/SW Discharge Note Marvetta Gibbons RN,BSN Transitions of Care Cross Coverage 6E - RN Case Manager [phone]   Patient Details  Name: Jasmine Williams MRN: [id number] Date of Birth: 1941/03/19  Transition of Care [facility]) CM/SW Contact:  Dawayne Patricia, RN Phone Number: 01/25/2019, 4:00 PM   Clinical Narrative:    Pt stable for transition home- DME RW ordered- per therapy note- pt close to baseline- no HH needed. - notified Zach with [employer] for DME need- RW to be delivered to room prior to discharge.    Final next level of care: Home/Self Care Barriers to Discharge: No Barriers Identified   Patient Goals and CMS Choice        Discharge Placement  Home                     Discharge Plan and Services                DME Arranged: Walker rolling DME Agency: [employer] Date DME Agency Contacted: 01/25/19 Time DME Agency Contacted: [phone] Representative spoke with at DME Agency: zach HH Arranged: NA [place] Agency: NA        Social Determinants of Health ([place]) Interventions     Readmission Risk Interventions Readmission Risk Prevention Plan 01/25/2019  Transportation Screening Complete  PCP or Specialist Appt within 5-7 Days Complete  Home Care Screening Complete  Medication Review (RN CM) Complete  Some recent data might be hidden

## 2019-01-25 NOTE — Discharge Summary (Signed)
Physician Discharge Summary  Jasmine Williams TZG:[id number] DOB: 1941/02/06 DOA: 01/23/2019  PCP: Orma Flaming, MD  Admit date: 01/23/2019 Discharge date: 01/25/2019  Admitted From: Home Disposition: Home  Recommendations for Outpatient Follow-up:  1. Follow up with PCP in 1-2 weeks 2. Please obtain CBC/BMP/Mag at follow up 3. Please follow up on the following pending results: None  Home Health: None Equipment/Devices: Rolling walker  Discharge Condition: Stable CODE STATUS: Full code  Hospital Course: 78 year old female with history of breast cancer, dementia, depression and hypothyroidism presenting with 2 days of nausea, vomiting and found to be in AKI with electrolyte derangement and significant lactic acidosis.  Infectious work-up including COVID-19, blood culture and urine culture negative.  CXR, CT head, CT abdomen and pelvis, VQ scan and echocardiogram not impressive.  Patient was resuscitated with IV fluid.  GI symptoms, lactic acidosis, AKI and electrolyte derangement resolved.  However, patient continued to have poor p.o. intake likely from underlying depression.  Per family, patient lost her husband recently and has been down since then.  Remeron added to her regimen.  She was recently started on low-dose Zoloft which could be uptitrated.  See individual problem list below for more.  Discharge Diagnoses:  Nausea/vomiting: Unclear etiology likely viral illness.  Resolved.  AKI: Likely due to GI loss from emesis.  Resolved with IV fluid.  Hypokalemia/hypomagnesemia/hypophosphatemia: Resolved except for mild hypophosphatemia at time of discharge. -Recheck renal panel at follow-up  SIRS: Likely due to the above 3.  No obvious source of infection.  May be possible viral illness.  Resolved.  Hypothyroidism: TSH within normal range. -Continue home Synthroid  Severe protein calorie malnutrition: BMI 16.  Suspect this to be due to underlying depression and possible  pathologic grief. -Continue home Zoloft -Added nightly Remeron. -Needs close follow-up for this.  Iron deficiency anemia -Recommend starting oral iron supplementation at follow-up -Not started at discharge to avoid GI irritation in the setting of acute illness.  Discharge Instructions  Discharge Instructions    Call MD for:  difficulty breathing, headache or visual disturbances   Complete by: As directed    Call MD for:  extreme fatigue   Complete by: As directed    Call MD for:  extreme fatigue   Complete by: As directed    Call MD for:  persistant dizziness or light-headedness   Complete by: As directed    Call MD for:  persistant dizziness or light-headedness   Complete by: As directed    Call MD for:  persistant nausea and vomiting   Complete by: As directed    Call MD for:  persistant nausea and vomiting   Complete by: As directed    Call MD for:  temperature >100.4   Complete by: As directed    Call MD for:  temperature >100.4   Complete by: As directed    Diet - low sodium heart healthy   Complete by: As directed    Diet general   Complete by: As directed    Discharge instructions   Complete by: As directed    It has been a pleasure taking care of you! You were admitted with nausea and vomiting.  Unclear what caused this.  Could be just viral infection.  Your symptoms improved.  Your kidney function and electrolyte normalized.  Please review your new medication list and the directions before you take your medications. Please call your primary care office as soon as possible to schedule hospital follow-up visit in 1 to  2 weeks.  Take care,   Discharge instructions   Complete by: As directed    It has been a pleasure taking care of you! You were admitted with kidney injury and electrolyte imbalance likely due to nausea and vomiting.  Your vomiting has subsided.  With that, your kidney numbers and electrolytes have normalized.  Please review your new medication list  and the directions before you take your medications. Please call your primary care office as soon as possible to schedule hospital follow-up visit in 1 to 2 weeks.  Take care,   Increase activity slowly   Complete by: As directed    Increase activity slowly   Complete by: As directed      Allergies as of 01/25/2019   No Known Allergies     Medication List    STOP taking these medications   omeprazole 20 MG tablet Commonly known as: PriLOSEC OTC     TAKE these medications   levothyroxine 25 MCG tablet Commonly known as: Synthroid Take 1 tablet (25 mcg total) by mouth daily before breakfast. Labs in 6-8 weeks   mirtazapine 15 MG tablet Commonly known as: REMERON Take 1 tablet (15 mg total) by mouth at bedtime.   pantoprazole 40 MG tablet Commonly known as: PROTONIX Take 1 tablet (40 mg total) by mouth daily.   sertraline 50 MG tablet Commonly known as: Zoloft Take 1 tablet (50 mg total) by mouth at bedtime.            Durable Medical Equipment  (From admission, onward)         Start     Ordered   01/25/19 1223  For home use only DME Walker rolling  Once    Question:  Patient needs a walker to treat with the following condition  Answer:  Weakness generalized   01/25/19 1222         Follow-up Information    Orma Flaming, MD. Schedule an appointment as soon as possible for a visit in 1 week(s).   Specialty: Family Medicine Contact information: [place]  [address] [phone]           Consultations:  None  Procedures/Studies: 2D Echo:  1. The left ventricle has normal systolic function with an ejection fraction of 60-65%. The cavity size was normal. Left ventricular diastolic parameters were normal.  2. The right ventricle has normal systolic function. The cavity was normal. There is no increase in right ventricular wall thickness.  3. Mild thickening of the mitral valve leaflet.  4. The aortic valve is tricuspid. Mild  thickening of the aortic valve. 5. The aortic root is normal in size and structure.  Ct Abdomen Pelvis Wo Contrast  Result Date: 01/23/2019 CLINICAL DATA:  78 year old female with nausea and vomiting. EXAM: CT ABDOMEN AND PELVIS WITHOUT CONTRAST TECHNIQUE: Multidetector CT imaging of the abdomen and pelvis was performed following the standard protocol without IV contrast. COMPARISON:  None. FINDINGS: Evaluation of this exam is limited in the absence of intravenous contrast. Lower chest: The visualized lung bases are clear. No intra-abdominal free air or free fluid. Hepatobiliary: There is mild fatty infiltration of the liver. No intrahepatic biliary ductal dilatation with cholecystectomy. No retained calcified stone noted in the central CBD. Pancreas: Unremarkable. No pancreatic ductal dilatation or surrounding inflammatory changes. Spleen: Normal in size without focal abnormality. Adrenals/Urinary Tract: The adrenal glands are unremarkable. There is a 3 mm nonobstructing right renal inferior pole calculus. No hydronephrosis. A 1 cm  hypodense lesion in the interpolar aspect of the left kidney is not characterized. There is no hydronephrosis or nephrolithiasis on the left. The visualized ureters and urinary bladder appear unremarkable. Stomach/Bowel: There is mild circumferential thickening of distal esophagus which may be related to underdistention or represent mild esophagitis related to reflux. Small amount of fluid noted in the distal esophagus. The stomach is distended with fluid content. There is no evidence of small-bowel obstruction. Thickened appearance of the colon most likely related to underdistention. Colitis is considered less likely. Clinical correlation is recommended. Small scattered sigmoid diverticula without active inflammatory changes noted. The appendix is normal. Vascular/Lymphatic: Mild aortoiliac atherosclerotic disease. No portal venous gas. There is no adenopathy. Reproductive: There  is a 5 cm right adnexal cystic lesion as well as a smaller cystic lesion in the left ovary. Further evaluation pelvic ultrasound on a nonemergent basis recommended. The uterus appears retroflexed. Other: None Musculoskeletal: Scoliosis and degenerative changes of the spine. Multilevel degenerative disc disease with vacuum phenomena. No acute osseous pathology. Old healed right pubic bone fractures noted. IMPRESSION: 1. Underdistention of the distal esophagus versus mild esophagitis secondary to reflux. Clinical correlation is recommended. 2. Scattered colonic diverticula. No bowel obstruction. Normal appendix. 3. A 3 mm nonobstructing right renal inferior pole calculus. No hydronephrosis. 4. A 5 cm right adnexal cystic lesion. Further evaluation with pelvic ultrasound on a nonemergent basis recommended. 5. Mild fatty liver. 6. Aortic Atherosclerosis (ICD10-I70.0). Electronically Signed   By: Anner Crete M.D.   On: 01/23/2019 19:18   Ct Head Wo Contrast  Result Date: 01/23/2019 CLINICAL DATA:  Fall out of bed today. Vomiting x2. Congestion with ear pain. EXAM: CT HEAD WITHOUT CONTRAST TECHNIQUE: Contiguous axial images were obtained from the base of the skull through the vertex without intravenous contrast. COMPARISON:  09/07/2018 FINDINGS: Brain: Ventricles, cisterns and other CSF spaces are within normal. There is mild chronic ischemic microvascular disease. There is no mass, mass effect, shift of midline structures or acute hemorrhage. No evidence of acute infarction. Vascular: No hyperdense vessel or unexpected calcification. Skull: Normal. Negative for fracture or focal lesion. Sinuses/Orbits: No acute finding. Other: None. IMPRESSION: No acute findings. Mild chronic ischemic microvascular disease. Electronically Signed   By: Marin Olp M.D.   On: 01/23/2019 17:02   Nm Pulmonary Perfusion  Result Date: 01/24/2019 CLINICAL DATA:  Suspected pulmonary embolism. Tachycardia. Elevated D-dimer. EXAM:  NUCLEAR MEDICINE PERFUSION LUNG SCAN TECHNIQUE: Perfusion images were obtained in multiple projections after intravenous injection of radiopharmaceutical. Ventilation scans intentionally deferred if perfusion scan and chest x-ray adequate for interpretation during COVID 19 epidemic. RADIOPHARMACEUTICALS:  1.65 mCi Tc-88m MAA IV COMPARISON:  Chest x-ray dated 01/23/2019. FINDINGS: No perfusion defect is identified within either lung. IMPRESSION: No evidence of pulmonary embolism. Electronically Signed   By: Franki Cabot M.D.   On: 01/24/2019 09:36   Dg Abd Acute W/chest  Result Date: 01/23/2019 CLINICAL DATA:  Vomiting. History of breast cancer. EXAM: DG ABDOMEN ACUTE W/ 1V CHEST COMPARISON:  None. FINDINGS: Normal sized heart. Clear lungs. The lungs are mildly hyperexpanded. Paucity of intestinal gas with no dilated bowel loops, air-fluid levels or free peritoneal air seen. Lumbar spine degenerative changes and mild moderate dextroconvex scoliosis. IMPRESSION: No acute abnormality. Mild changes of COPD. Electronically Signed   By: Claudie Revering M.D.   On: 01/23/2019 18:12      Subjective: No major events overnight of this morning.  Sleepy but arises easily.  Does not want to talk.  She  says "I am fine.  Please leave me alone and let me sleep"   Discharge Exam: Vitals:   01/24/19 2025 01/25/19 0609  BP: (!) 126/41 132/61  Pulse: 68 65  Resp:    Temp: (!) 97.4 F (36.3 C) (!) 97.4 F (36.3 C)  SpO2: 99% 98%    GENERAL: No acute distress.  HEENT: Vision and hearing grossly intact.  LUNGS:  No IWOB.  No respiratory distress. MSK/EXT:  Moves all extremities. No apparent deformity. No edema bilaterally.  Significant muscle mass wasting. SKIN: no apparent skin lesion or wound NEURO: Sleepy but arises easily.  No gross neuro deficits. PSYCH: Irritable and angry  The results of significant diagnostics from this hospitalization (including imaging, microbiology, ancillary and laboratory) are  listed below for reference.     Microbiology: Recent Results (from the past 240 hour(s))  SARS Coronavirus 2 (CEPHEID - Performed in [place] hospital lab), Hosp Order     Status: None   Collection Time: 01/23/19  4:42 PM   Specimen: Nasopharyngeal Swab  Result Value Ref Range Status   SARS Coronavirus 2 NEGATIVE NEGATIVE Final    Comment: (NOTE) If result is NEGATIVE SARS-CoV-2 target nucleic acids are NOT DETECTED. The SARS-CoV-2 RNA is generally detectable in upper and lower  respiratory specimens during the acute phase of infection. The lowest  concentration of SARS-CoV-2 viral copies this assay can detect is 250  copies / mL. A negative result does not preclude SARS-CoV-2 infection  and should not be used as the sole basis for treatment or other  patient management decisions.  A negative result may occur with  improper specimen collection / handling, submission of specimen other  than nasopharyngeal swab, presence of viral mutation(s) within the  areas targeted by this assay, and inadequate number of viral copies  (<250 copies / mL). A negative result must be combined with clinical  observations, patient history, and epidemiological information. If result is POSITIVE SARS-CoV-2 target nucleic acids are DETECTED. The SARS-CoV-2 RNA is generally detectable in upper and lower  respiratory specimens dur ing the acute phase of infection.  Positive  results are indicative of active infection with SARS-CoV-2.  Clinical  correlation with patient history and other diagnostic information is  necessary to determine patient infection status.  Positive results do  not rule out bacterial infection or co-infection with other viruses. If result is PRESUMPTIVE POSTIVE SARS-CoV-2 nucleic acids MAY BE PRESENT.   A presumptive positive result was obtained on the submitted specimen  and confirmed on repeat testing.  While 2019 novel coronavirus  (SARS-CoV-2) nucleic acids may be present in  the submitted sample  additional confirmatory testing may be necessary for epidemiological  and / or clinical management purposes  to differentiate between  SARS-CoV-2 and other Sarbecovirus currently known to infect humans.  If clinically indicated additional testing with an alternate test  methodology [id number]) is advised. The SARS-CoV-2 RNA is generally  detectable in upper and lower respiratory sp ecimens during the acute  phase of infection. The expected result is Negative. Fact Sheet for Patients:  StrictlyIdeas.no Fact Sheet for Healthcare Providers: BankingDealers.co.za This test is not yet approved or cleared by the [place] FDA and has been authorized for detection and/or diagnosis of SARS-CoV-2 by FDA under an Emergency Use Authorization (EUA).  This EUA will remain in effect (meaning this test can be used) for the duration of the COVID-19 declaration under Section 564(b)(1) of the Act, 21 U.S.C. section 360bbb-3(b)(1), unless the authorization is  terminated or revoked sooner. Performed at [facility], [place] [address] [place], [place] [address]   Urine culture     Status: None   Collection Time: 01/23/19  7:57 PM   Specimen: In/Out Cath Urine  Result Value Ref Range Status   Specimen Description   Final    IN/OUT CATH URINE Performed at [place] [address], [place], [place] [address]    Special Requests   Final    NONE Performed at [facility], [place] [address] [place], [place] [address]    Culture   Final    NO GROWTH Performed at [facility], [place] [address] [place], [place] [address]    Report Status 01/25/2019 FINAL  Final  Blood culture (routine x 2)     Status: None (Preliminary result)   Collection Time: 01/23/19  9:01 PM   Specimen: BLOOD LEFT FOREARM  Result Value Ref Range Status   Specimen Description   Final    BLOOD LEFT  FOREARM Performed at [place] [address] [place], [place] [address]    Special Requests   Final    BOTTLES DRAWN AEROBIC AND ANAEROBIC Blood Culture adequate volume Performed at [facility] [place] [address] [place], [place] [address]    Culture   Final    NO GROWTH 1 DAY Performed at [facility], [place] [address], [place], [place] [address]    Report Status PENDING  Incomplete  Blood culture (routine x 2)     Status: None (Preliminary result)   Collection Time: 01/23/19  9:02 PM   Specimen: BLOOD RIGHT FOREARM  Result Value Ref Range Status   Specimen Description   Final    BLOOD RIGHT FOREARM Performed at [place] [address], [place], [place] [address]    Special Requests   Final    BOTTLES DRAWN AEROBIC AND ANAEROBIC Blood Culture adequate volume Performed at [place] [address] [place], [place] [address]    Culture   Final    NO GROWTH 1 DAY Performed at [facility], [place] [address] [place], [place] [address]    Report Status PENDING  Incomplete     Labs: BNP (last 3 results) No results for input(s): BNP in the last 8760 hours. Basic Metabolic Panel: Recent Labs  Lab 01/23/19 1642 01/23/19 2101 01/24/19 1148 01/25/19 0419  NA 141  --  140 137   137  K 2.7*  --  3.1* 4.0   4.0  CL 80*  --  98 104   104  CO2 31  --  25 24   23   GLUCOSE 197*  --  86 86   86  BUN 28*  --  22 18   18   CREATININE 1.88*  --  1.42* 0.96   1.00  CALCIUM 9.7  --  7.4* 7.0*   6.9*  MG  --  2.2 2.0 1.8  PHOS  --   --   --  1.7*   Liver Function Tests: Recent Labs  Lab 01/23/19 1642 01/24/19 1148 01/25/19 0419  AST 31 22  --   ALT 21 15  --   ALKPHOS 109 73  --   BILITOT 1.3* 1.4*  --   PROT 7.6 5.6*  --   ALBUMIN 3.9 2.7* 2.2*   No results for input(s): LIPASE, AMYLASE in the last 168 hours. No  results for input(s): AMMONIA in the last 168 hours. CBC: Recent  Labs  Lab 01/23/19 1642 01/24/19 1148 01/25/19 0419  WBC 12.8* 11.4* 10.5  NEUTROABS 10.7*  --  7.6  HGB 12.6 10.1* 8.9*  HCT 41.4 33.0* 29.3*  MCV 84.7 87.8 86.9  PLT 704* 514* 426*   Cardiac Enzymes: No results for input(s): CKTOTAL, CKMB, CKMBINDEX, TROPONINI in the last 168 hours. BNP: Invalid input(s): POCBNP CBG: No results for input(s): GLUCAP in the last 168 hours. D-Dimer Recent Labs    01/23/19 2101  DDIMER 1.07*   Hgb A1c No results for input(s): HGBA1C in the last 72 hours. Lipid Profile Recent Labs    01/24/19 1148  CHOL 152  HDL 81  LDLCALC 57  TRIG 69  CHOLHDL 1.9   Thyroid function studies No results for input(s): TSH, T4TOTAL, T3FREE, THYROIDAB in the last 72 hours.  Invalid input(s): FREET3 Anemia work up No results for input(s): VITAMINB12, FOLATE, FERRITIN, TIBC, IRON, RETICCTPCT in the last 72 hours. Urinalysis    Component Value Date/Time   COLORURINE YELLOW 01/23/2019 1642   APPEARANCEUR HAZY (A) 01/23/2019 1642   LABSPEC 1.016 01/23/2019 1642   PHURINE 8.0 01/23/2019 1642   GLUCOSEU NEGATIVE 01/23/2019 1642   GLUCOSEU NEGATIVE 11/25/2018 1206   HGBUR NEGATIVE 01/23/2019 1642   BILIRUBINUR NEGATIVE 01/23/2019 1642   BILIRUBINUR N 12/04/2018 1035   KETONESUR 20 (A) 01/23/2019 1642   PROTEINUR 30 (A) 01/23/2019 1642   UROBILINOGEN 0.2 12/04/2018 1035   UROBILINOGEN 0.2 11/25/2018 1206   NITRITE NEGATIVE 01/23/2019 1642   LEUKOCYTESUR NEGATIVE 01/23/2019 1642   Sepsis Labs Invalid input(s): PROCALCITONIN,  WBC,  LACTICIDVEN   Time coordinating discharge: 35 minutes  SIGNED:  Mercy Riding, MD  [facility] 01/25/2019, 12:23 PM  If 7PM-7AM, please contact night-coverage [email or web address] Password [email or web address]

## 2019-01-25 NOTE — Evaluation (Signed)
Occupational Therapy Evaluation Patient Details Name: Jasmine Williams MRN: [id number] DOB: Jul 13, 1940 Today's Date: 01/25/2019    History of Present Illness 78 year old Caucasian female with w hx of ccy, hx of breast cancer, invasive ductal carcinoma ER/PR+, Her2 +, w associated DCIS s/p R lumpectomy 08/05/2011 s/p XRT, most recently a new diagnosis of thrombocytosis (985)  of unclear etiology 11/25/18  Patient was admitted 01/23/19 with 2-day history of nausea, vomiting, acute kidney injury and electrolyte abnormalities.    Clinical Impression   Patient supine in bed, asleep upon arrival but easily roused.  Cueing to maintain eyes open and engage with therapist, but agreeable to participate with OT.  She transitioned to EOB with min assist, to initiate mobility, once EOB declined further mobility voicing "I just want to get back to bed, when can we get back to bed".  When questioned about PLOF and ADLs, patient voicing "I just don't know!". Patient becoming increasingly agitated, although able to follow commands to scoot towards [facility] with supervision and return to supine with supervision for safety. Anticipate she is min assist to supervision level for ADLs, but pt declined to engage in ADLs tasks. Based on performance today, recommending continued OT services while admitted and after dc at [facility] level in order return to PLOF with ADLs and ensure maximal home safety.  Will follow acutely.     Follow Up Recommendations  Home health OT;Supervision/Assistance - 24 hour    Equipment Recommendations  Tub/shower seat    Recommendations for Other Services       Precautions / Restrictions Precautions Precautions: Fall Precaution Comments: hx falls, blackened R eye Restrictions Weight Bearing Restrictions: No      Mobility Bed Mobility Overal bed mobility: Needs Assistance Bed Mobility: Supine to Sit;Sit to Supine     Supine to sit: Min assist Sit to supine: Supervision   General bed  mobility comments: min assist to initate movement towards EOB, supervision to return to supine   Transfers Overall transfer level: Needs assistance Equipment used: None Transfers: Sit to/from Stand Sit to Stand: Min guard         General transfer comment: pt declined further mobility, able to scoot towards HOB with supervisio n    Balance Overall balance assessment: Needs assistance Sitting-balance support: No upper extremity supported;Feet supported Sitting balance-Leahy Scale: Fair     Standing balance support: No upper extremity supported;During functional activity Standing balance-Leahy Scale: Fair                             ADL either performed or assessed with clinical judgement   ADL Overall ADL's : Needs assistance/impaired                                       General ADL Comments: anticipate she is min-setup assist for ADLs seated, once sitting EOB pt declined further mobility      Vision   Additional Comments: keeps eyes closed through majority of session, but when opens eyes appears [facility]      Perception     Praxis      Pertinent Vitals/Pain Pain Assessment: Faces Faces Pain Scale: No hurt Pain Location: generalized with movement Pain Descriptors / Indicators: Grimacing;Guarding Pain Intervention(s): Limited activity within patient's tolerance;Monitored during session;Premedicated before session;Repositioned     Hand Dominance     Extremity/Trunk Assessment Upper Extremity  Assessment Upper Extremity Assessment: Generalized weakness   Lower Extremity Assessment Lower Extremity Assessment: Defer to PT evaluation       Communication Communication Communication: Other (comment)(decreased cooperation with PLOF and Independence questions)   Cognition Arousal/Alertness: Lethargic Behavior During Therapy: Impulsive;Agitated(uncooperative) Overall Cognitive Status: Difficult to assess                                  General Comments: pt reports "I just want to go back to bed and go to sleep" throughout session, pt very uncooperative therefore difficult to assess cognition.  when questioned about ADls at home pt reports "I just don't know" questioning cognition    General Comments  Pt with blackened R eye and bruising on L forearm, when pt asked if pt fell she stated "Just because I have a black eye doesn't mean I fell!', when asked further questions on topic pt refused to answer    Exercises     Shoulder Instructions      Home Living Family/patient expects to be discharged to:: Private residence Living Arrangements: Children Available Help at Discharge: Family;Available 24 hours/day Type of Home: House Home Access: Stairs to enter [employer] of Steps: 1   Home Layout: One level     Bathroom Shower/Tub: Chief Strategy Officer: (pt refuses to answer )   Additional Comments: per PT note, patient refuses to answer voicing "I just dont know"       Prior Functioning/Environment Level of Independence: Independent        Comments: reports independence with mobility and ADLs- per PT eval, pt refuses to answer         OT Problem List: Decreased strength;Decreased activity tolerance;Impaired balance (sitting and/or standing);Decreased cognition;Decreased safety awareness;Decreased knowledge of use of DME or AE;Decreased knowledge of precautions      OT Treatment/Interventions: Self-care/ADL training;Therapeutic exercise;DME and/or AE instruction;Therapeutic activities;Patient/family education;Balance training    OT Goals(Current goals can be found in the care plan section) Acute Rehab OT Goals Patient Stated Goal: none stated OT Goal Formulation: (pt declining to participate ) Time For Goal Achievement: 02/08/19 Potential to Achieve Goals: Fair  OT Frequency: Min 2X/week   Barriers to D/C:            Co-evaluation              AM-PAC OT  "6 Clicks" Daily Activity     Outcome Measure Help from another person eating meals?: A Little Help from another person taking care of personal grooming?: A Little Help from another person toileting, which includes using toliet, bedpan, or urinal?: A Little Help from another person bathing (including washing, rinsing, drying)?: A Little Help from another person to put on and taking off regular upper body clothing?: A Little Help from another person to put on and taking off regular lower body clothing?: A Little 6 Click Score: 18   End of Session Nurse Communication: Mobility status  Activity Tolerance: Treatment limited secondary to agitation Patient left: in bed;with call bell/phone within reach;with bed alarm set  OT Visit Diagnosis: Other abnormalities of gait and mobility (R26.89);Muscle weakness (generalized) (M62.81);Other symptoms and signs involving cognitive function                Time: 9470-9628 OT Time Calculation (min): 10 min Charges:  OT General Charges $OT Visit: 1 Visit OT Evaluation $OT Eval Moderate Complexity: 1  Mod  Delight Stare, OT Acute Rehabilitation Services Pager [phone] Office [phone]   Delight Stare 01/25/2019, 1:34 PM

## 2019-01-25 NOTE — Evaluation (Signed)
Physical Therapy Evaluation Patient Details Name: Jasmine Williams MRN: [id number] DOB: [birth year] Today's Date: 01/25/2019   History of Present Illness  78 year old Caucasian female with w hx of ccy, hx of breast cancer, invasive ductal carcinoma ER/PR+, Her2 +, w associated DCIS s/p R lumpectomy 08/05/2011 s/p XRT, most recently a new diagnosis of thrombocytosis (985)  of unclear etiology 11/25/18  Patient was admitted 01/23/19 with 2-day history of nausea, vomiting, acute kidney injury and electrolyte abnormalities.   Clinical Impression  Pt asleep on entry, easily roused however once awake very agitated with therapist and simple questions to determine PLOF and home set up. Pt reports she lives with son in single story home with 1 step entrance. When PT noted blackened eye and bruising on UE and questioned about prior falls. Pt became irate and stopped responding to questions. Pt stated she had to get up to bathroom and immediately got out of bed and started ambulating to bathroom reaching out for bed rail and sink to steady. Despite cuing for awareness of cords pt nearly tripped over cord returning to bed and proceeded to turn in a manner such as to wrap herself in the cord. PT able to untangle her at which point she closed her eyes and pulled the blanket over her head. PT recommends RW to improve stability at home. Pt is likely at her baseline level of function.     Follow Up Recommendations Supervision for mobility/OOB;No PT follow up    Equipment Recommendations  Rolling walker with 5" wheels       Precautions / Restrictions Precautions Precautions: Fall Precaution Comments: hx falls, blackened R eye Restrictions Weight Bearing Restrictions: No      Mobility  Bed Mobility Overal bed mobility: Modified Independent             General bed mobility comments: use of bed rails to pull to EoB  Transfers Overall transfer level: Needs assistance Equipment used: None Transfers: Sit  to/from Stand Sit to Stand: Min guard         General transfer comment: min guard for safety due to increased speed and inattention to position of lines  Ambulation/Gait Ambulation/Gait assistance: Min guard Gait Distance (Feet): 20 Feet Assistive device: None Gait Pattern/deviations: Step-through pattern;Decreased step length - right;Decreased step length - left;Staggering left;Staggering right Gait velocity: too fast for conditions Gait velocity interpretation: <1.8 ft/sec, indicate of risk for recurrent falls General Gait Details: min guard for safety and management of lines, pt with staggering gait, requiring UE support from bed and sink to maintain balance          Balance Overall balance assessment: Needs assistance Sitting-balance support: No upper extremity supported;Feet supported Sitting balance-Leahy Scale: Fair     Standing balance support: No upper extremity supported;During functional activity Standing balance-Leahy Scale: Fair                               Pertinent Vitals/Pain Pain Assessment: Faces Faces Pain Scale: Hurts little more Pain Location: generalized with movement Pain Descriptors / Indicators: Grimacing;Guarding Pain Intervention(s): Limited activity within patient's tolerance;Monitored during session;Premedicated before session;Repositioned    Home Living Family/patient expects to be discharged to:: Private residence Living Arrangements: Children Available Help at Discharge: Family;Available 24 hours/day Type of Home: House Home Access: Stairs to enter   [employer] of Steps: 1 Home Layout: One level Home Equipment: (pt refuses to answer )      Prior  Function Level of Independence: Independent         Comments: reports independence with mobility and ADLs        Extremity/Trunk Assessment   Upper Extremity Assessment Upper Extremity Assessment: Overall WFL for tasks assessed    Lower Extremity  Assessment Lower Extremity Assessment: Overall WFL for tasks assessed       Communication   Communication: Other (comment)(decreased cooperation with PLOF and Independence questions)  Cognition Arousal/Alertness: Awake/alert Behavior During Therapy: Impulsive;Agitated(very uncooperative) Overall Cognitive Status: Difficult to assess                                 General Comments: irritated by PT Evaluation, yelling "why do you want to know" when asked about home set up, decreased safety awareness with mobility, impatient, almost tripping over cord in attempt to return to bed, refuses to follow commands for safety or use DME for stability      General Comments General comments (skin integrity, edema, etc.): Pt with blackened R eye and bruising on L forearm, when pt asked if pt fell she stated "Just because I have a black eye doesn't mean I fell!', when asked further questions on topic pt refused to answer        Assessment/Plan    PT Assessment Patient needs continued PT services  PT Problem List Decreased balance;Decreased activity tolerance;Decreased safety awareness       PT Treatment Interventions DME instruction;Gait training;Stair training;Functional mobility training;Therapeutic activities;Therapeutic exercise;Balance training;Cognitive remediation;Patient/family education    PT Goals (Current goals can be found in the Care Plan section)  Acute Rehab PT Goals Patient Stated Goal: none stated PT Goal Formulation: With patient Time For Goal Achievement: 02/08/19 Potential to Achieve Goals: Fair    Frequency Min 3X/week    AM-PAC PT "6 Clicks" Mobility  Outcome Measure Help needed turning from your back to your side while in a flat bed without using bedrails?: None Help needed moving from lying on your back to sitting on the side of a flat bed without using bedrails?: None Help needed moving to and from a bed to a chair (including a wheelchair)?:  None Help needed standing up from a chair using your arms (e.g., wheelchair or bedside chair)?: None Help needed to walk in hospital room?: A Little Help needed climbing 3-5 steps with a railing? : A Little 6 Click Score: 22    End of Session   Activity Tolerance: Treatment limited secondary to agitation Patient left: in bed;with call bell/phone within reach;with bed alarm set Nurse Communication: Mobility status PT Visit Diagnosis: Unsteadiness on feet (R26.81);Other abnormalities of gait and mobility (R26.89);Muscle weakness (generalized) (M62.81);Difficulty in walking, not elsewhere classified (R26.2)    Time: 1030-1045 PT Time Calculation (min) (ACUTE ONLY): 15 min   Charges:   PT Evaluation $PT Eval Moderate Complexity: 1 Mod           B. Van Fleet PT, DPT Acute Rehabilitation Services Pager [phone] Office [phone]    B Van Fleet 01/25/2019, 12:57 PM   

## 2019-01-26 ENCOUNTER — Telehealth: Payer: Self-pay | Admitting: Family Medicine

## 2019-01-26 ENCOUNTER — Encounter: Payer: Self-pay | Admitting: Neurology

## 2019-01-26 DIAGNOSIS — R413 Other amnesia: Secondary | ICD-10-CM

## 2019-01-26 DIAGNOSIS — R634 Abnormal weight loss: Secondary | ICD-10-CM

## 2019-01-26 DIAGNOSIS — F0391 Unspecified dementia with behavioral disturbance: Secondary | ICD-10-CM

## 2019-01-26 NOTE — Telephone Encounter (Signed)
Would favor f/u sooner. I see openings available. Please reschedule. Later this week or next week. Needs blood work f/u.

## 2019-01-26 NOTE — Telephone Encounter (Signed)
I called patient's daughter to schedule a hospital follow up appt for Ms. Jasmine Williams. We got her scheduled for Monday August 3rd, but she wanted to be sure that appt was OK and not too far out. Please contact pt's daughter at [phone] to reschedule her appt if pt needs to be seen sooner. Patient's daughter was also asking about a referral Annalina Needles was supposed to have to neurology, I explained that there are two here that said patient refusal but the pt's daughter says that there should have been a new referral put in for her. Please contact her about a new referral for Jasmine Williams to neurology. Thank you!

## 2019-01-26 NOTE — Addendum Note (Signed)
Addended by: Layla Barter on: 01/26/2019 11:19 AM   Modules accepted: Orders

## 2019-01-26 NOTE — Telephone Encounter (Signed)
General/Other - Call back  Please call the patient back on her mobile number to speak to her daughter [name].

## 2019-01-26 NOTE — Telephone Encounter (Signed)
Dr Jonni Sanger: Pt was in ER on 7/18 for nausea and vomiting. Is the 8/3 appointment ok?  Stephanie: Any update on the reopening of the neurology referral for memory loss?

## 2019-01-26 NOTE — Telephone Encounter (Signed)
Ok to add Zofran 4 mg TID as needed for nausea per Dr. Jonni Sanger.. Tried calling daughter(Toni) back since mom is nauseous and vomiting, no answer.. LM on voicemail to return call

## 2019-01-27 ENCOUNTER — Telehealth: Payer: Self-pay

## 2019-01-28 NOTE — Telephone Encounter (Signed)
Left voice message for patient to call clinic,regarding hospital stay and to schedule a hospital F/U

## 2019-01-29 LAB — CULTURE, BLOOD (ROUTINE X 2)
Culture: NO GROWTH
Culture: NO GROWTH
Special Requests: ADEQUATE
Special Requests: ADEQUATE

## 2019-02-01 ENCOUNTER — Other Ambulatory Visit: Payer: Self-pay | Admitting: Family Medicine

## 2019-02-01 ENCOUNTER — Inpatient Hospital Stay: Payer: Medicare Other | Admitting: Family Medicine

## 2019-02-06 NOTE — Telephone Encounter (Signed)
Left voice message to call clinic 

## 2019-02-06 NOTE — Telephone Encounter (Signed)
Called daughter LMOVM to return call

## 2019-02-06 NOTE — Telephone Encounter (Signed)
Pt deceased, per EMS call

## 2019-02-06 DEATH — deceased

## 2019-02-08 ENCOUNTER — Inpatient Hospital Stay: Payer: Medicare Other | Admitting: Family Medicine

## 2019-02-16 ENCOUNTER — Other Ambulatory Visit: Payer: Self-pay | Admitting: Oncology

## 2019-02-16 LAB — JAK2 (INCLUDING V617F AND EXON 12), MPL,& CALR-NEXT GEN SEQ

## 2019-03-02 ENCOUNTER — Ambulatory Visit: Payer: Medicare Other | Admitting: Neurology

## 2019-03-17 ENCOUNTER — Ambulatory Visit: Payer: Medicare Other | Admitting: Family Medicine

## 2019-03-30 IMAGING — CR DG HIP (WITH OR WITHOUT PELVIS) 2-3V*R*
3 series · 3 of 3 positions shown · non-contrast
Comparison: None.

CLINICAL DATA: 76-year-old female with right leg pain x2 weeks.

EXAM:
DG HIP (WITH OR WITHOUT PELVIS) 2-3V RIGHT

[x pelvis]
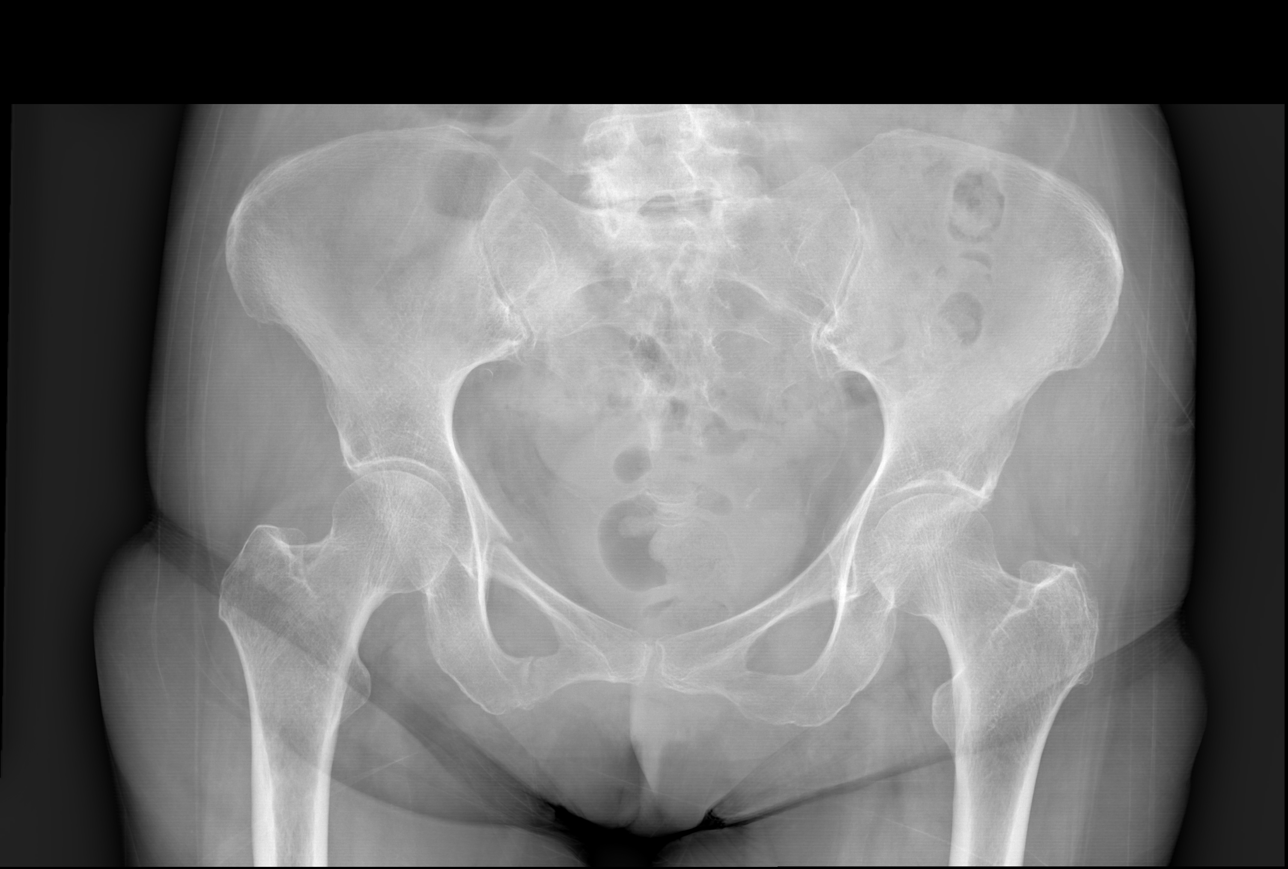

[x hip ap right]
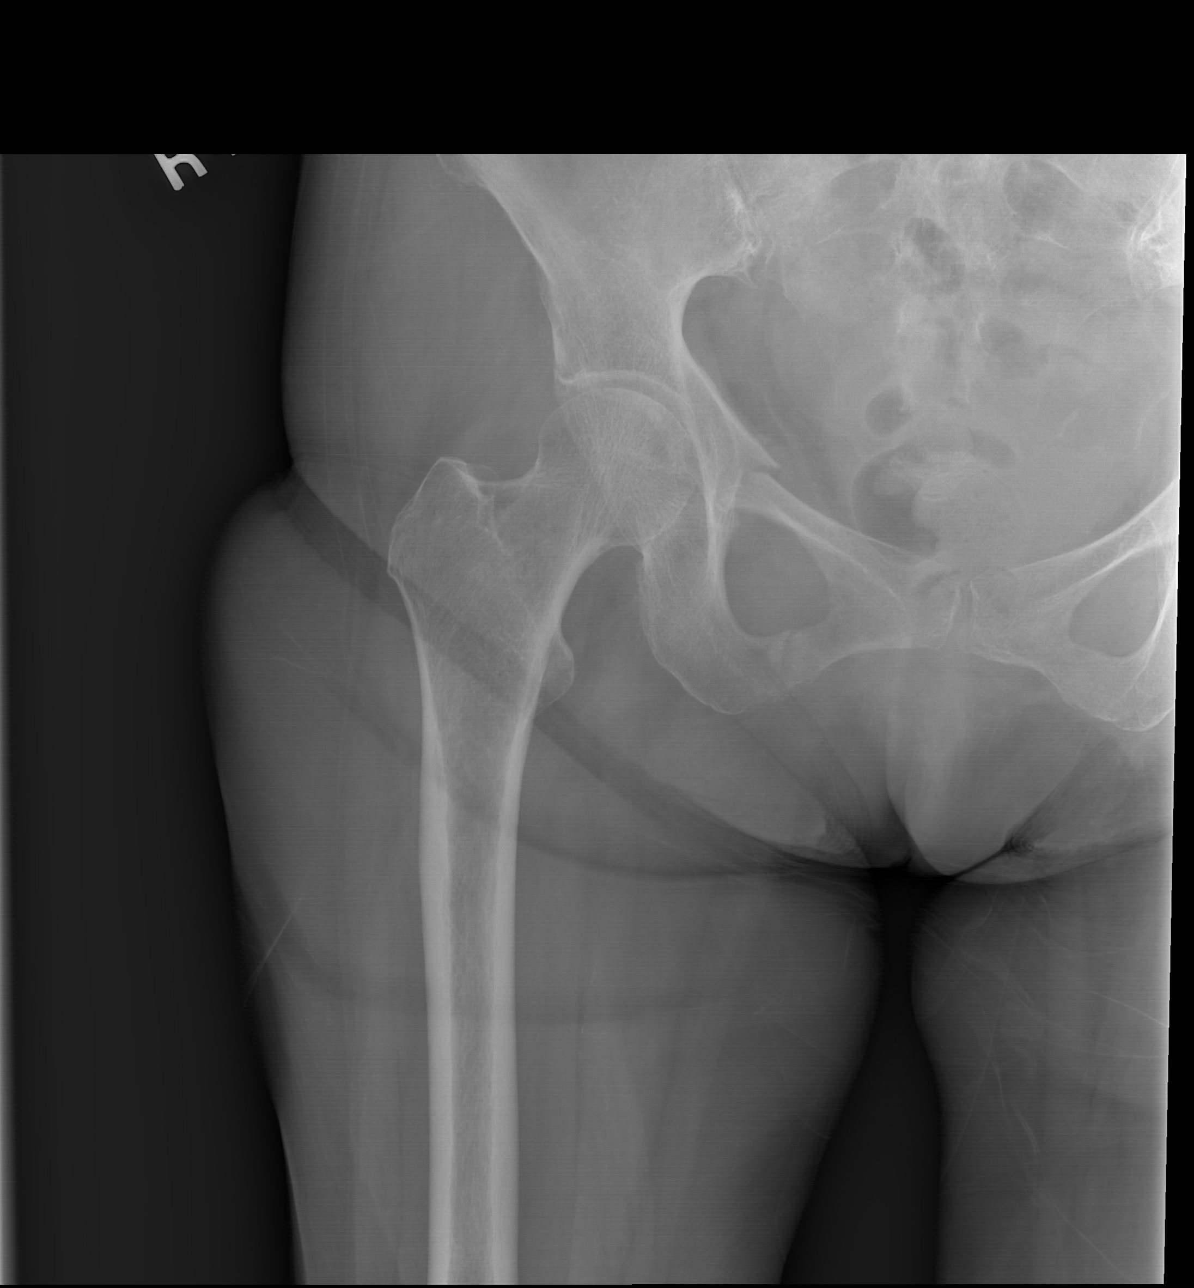

[x hip lat right]
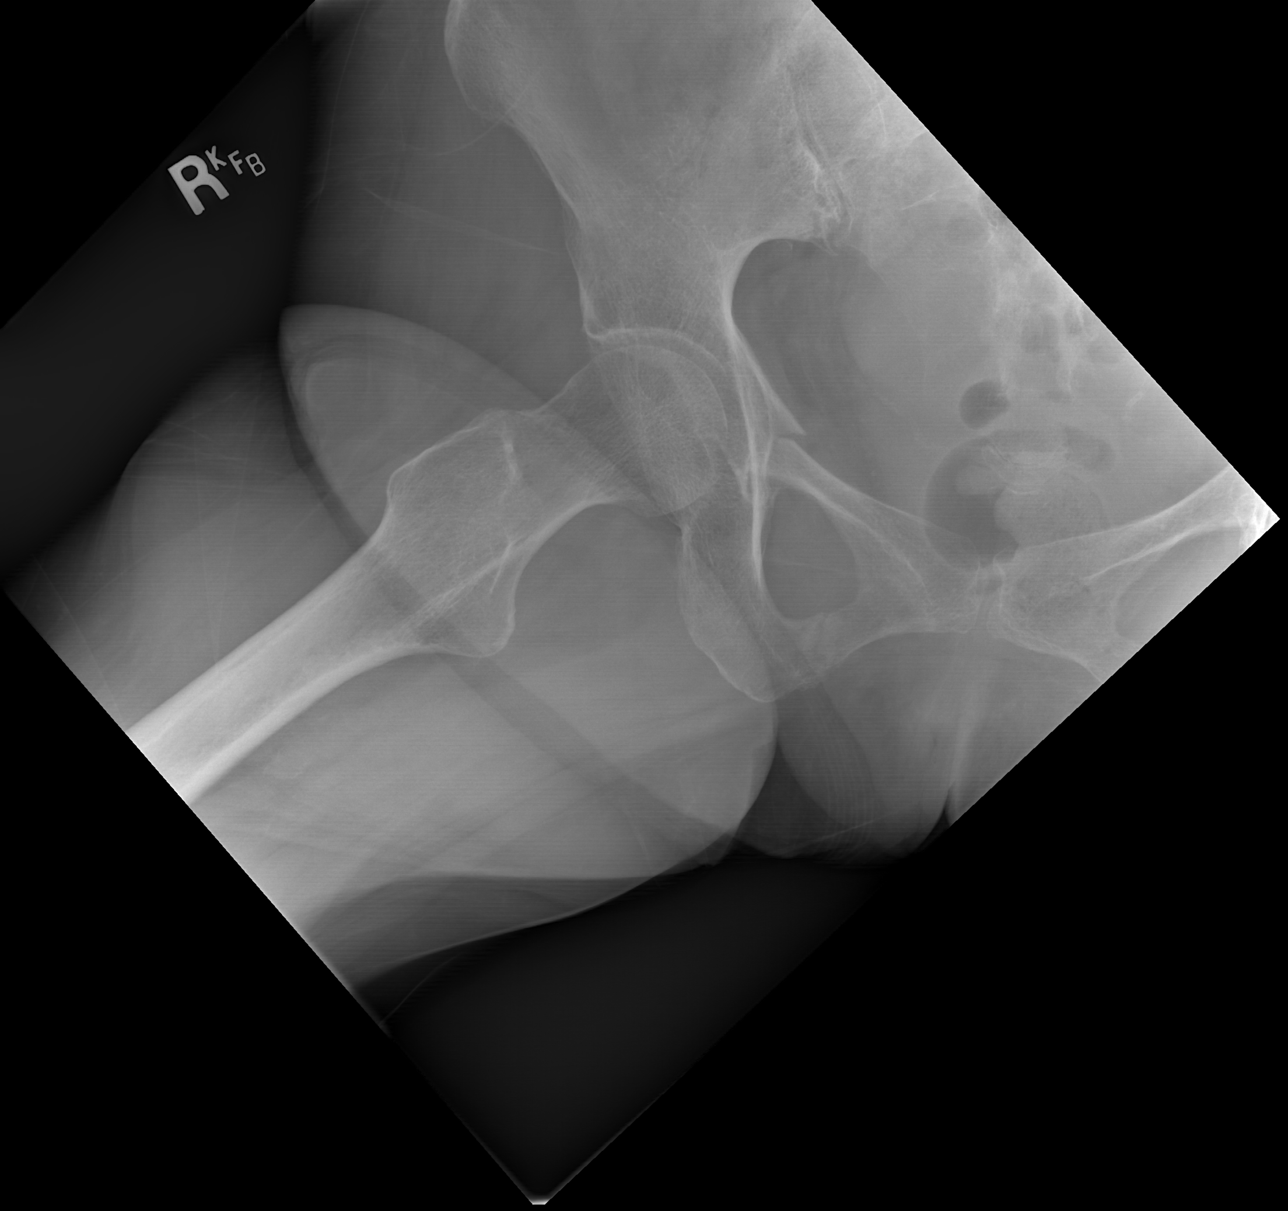

[3 of 3 positions shown; findings below may reference images not displayed]

FINDINGS: There are minimally displaced fractures of the right superior and
inferior pubic rami. There is apparent sclerotic changes of the
edges of the inferior pubic ramus fracture which may indicate a
somewhat subacute timeframe. Acute fracture is not excluded.
Correlation with clinical exam recommended. No other fracture
identified. The bones are osteopenic. There are degenerative changes
of the lower lumbar spine. The soft tissues appear unremarkable.
IMPRESSION: Minimally displaced fractures of the right superior and inferior
pubic rami, acute or possibly subacute. Clinical correlation is
recommended.

## 2020-12-24 IMAGING — NM NM PULMONARY PERFUSION PARTICULATE
8 series · 8 of 8 positions shown · non-contrast
Comparison: Chest x-ray dated 01/23/2019.

CLINICAL DATA: Suspected pulmonary embolism. Tachycardia. Elevated
D-dimer.

EXAM:
NUCLEAR MEDICINE PERFUSION LUNG SCAN
TECHNIQUE: Perfusion images were obtained in multiple projections after
intravenous injection of radiopharmaceutical.
Ventilation scans intentionally deferred if perfusion scan and chest
x-ray adequate for interpretation during COVID 19 epidemic.
RADIOPHARMACEUTICALS:  1.65 mCi Rc-FFm MAA IV

[Series 1: ant/post perf · 4.14mm/px · 1 of 1 slices shown (1 of 2)]
[im 1/1]
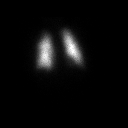

[Series 1: ant/post perf · 4.14mm/px · 1 of 1 slices shown (2 of 2)]
[im 1/1]
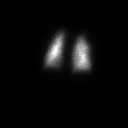

[Series 2: lao/rpo perf · 4.14mm/px · 1 of 1 slices shown (1 of 2)]
[im 1/1]
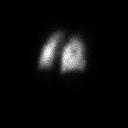

[Series 2: lao/rpo perf · 4.14mm/px · 1 of 1 slices shown (2 of 2)]
[im 1/1]
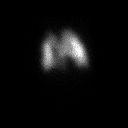

[Series 3: lpo/rao perf · 4.14mm/px · 1 of 1 slices shown (1 of 2)]
[im 1/1]
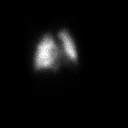

[Series 3: lpo/rao perf · 4.14mm/px · 1 of 1 slices shown (2 of 2)]
[im 1/1]
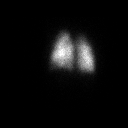

[Series 4: lt lat/rt lat perf · 4.14mm/px · 1 of 1 slices shown (1 of 2)]
[im 1/1]
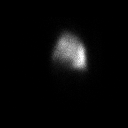

[Series 4: lt lat/rt lat perf · 4.14mm/px · 1 of 1 slices shown (2 of 2)]
[im 1/1]
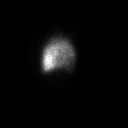

[8 of 8 positions shown; findings below may reference images not displayed]

FINDINGS: No perfusion defect is identified within either lung.
IMPRESSION: No evidence of pulmonary embolism.
